# Patient Record
Sex: Female | Born: 1935
Health system: Southern US, Community
[De-identification: ages and names within clinical notes are randomized; demographics above are authoritative.]

## PROBLEM LIST (undated history)

## (undated) DIAGNOSIS — N8110 Cystocele, unspecified: Secondary | ICD-10-CM

## (undated) DIAGNOSIS — I10 Essential (primary) hypertension: Secondary | ICD-10-CM

## (undated) HISTORY — PX: ABDOMINAL HYSTERECTOMY: SHX81

## (undated) HISTORY — PX: PACEMAKER INSERTION: SHX728

---

## 1985-09-03 HISTORY — PX: BREAST BIOPSY: SHX20

## 1988-09-03 HISTORY — PX: BREAST SURGERY: SHX581

## 2003-09-04 HISTORY — PX: COLONOSCOPY: SHX174

## 2004-04-21 ENCOUNTER — Other Ambulatory Visit: Payer: Self-pay

## 2004-06-22 ENCOUNTER — Ambulatory Visit: Payer: Self-pay | Admitting: General Surgery

## 2004-09-29 ENCOUNTER — Ambulatory Visit: Payer: Self-pay | Admitting: Family Medicine

## 2004-11-07 ENCOUNTER — Ambulatory Visit: Payer: Self-pay | Admitting: Family Medicine

## 2007-08-04 ENCOUNTER — Ambulatory Visit: Payer: Self-pay | Admitting: Family Medicine

## 2008-09-20 ENCOUNTER — Ambulatory Visit: Payer: Self-pay | Admitting: Family Medicine

## 2009-11-04 ENCOUNTER — Ambulatory Visit: Payer: Self-pay | Admitting: Family Medicine

## 2010-12-20 ENCOUNTER — Ambulatory Visit: Payer: Self-pay | Admitting: Family Medicine

## 2010-12-21 ENCOUNTER — Ambulatory Visit: Payer: Self-pay | Admitting: Cardiology

## 2010-12-27 ENCOUNTER — Ambulatory Visit: Payer: Self-pay | Admitting: Cardiology

## 2012-02-20 ENCOUNTER — Ambulatory Visit: Payer: Self-pay | Admitting: Family Medicine

## 2012-08-12 ENCOUNTER — Ambulatory Visit: Payer: Self-pay | Admitting: Internal Medicine

## 2012-08-12 LAB — CBC WITH DIFFERENTIAL/PLATELET
Basophil #: 0.1 10*3/uL (ref 0.0–0.1)
Eosinophil #: 0.1 10*3/uL (ref 0.0–0.7)
HGB: 14.2 g/dL (ref 12.0–16.0)
Lymphocyte #: 0.7 10*3/uL — ABNORMAL LOW (ref 1.0–3.6)
MCV: 92 fL (ref 80–100)
Monocyte #: 0.3 x10 3/mm (ref 0.2–0.9)
Monocyte %: 6.5 %
Neutrophil #: 3.5 10*3/uL (ref 1.4–6.5)
Neutrophil %: 75.2 %
Platelet: 125 10*3/uL — ABNORMAL LOW (ref 150–440)
RBC: 4.63 10*6/uL (ref 3.80–5.20)
RDW: 13.5 % (ref 11.5–14.5)
WBC: 4.7 10*3/uL (ref 3.6–11.0)

## 2012-08-12 LAB — COMPREHENSIVE METABOLIC PANEL
Albumin: 4.2 g/dL (ref 3.4–5.0)
Alkaline Phosphatase: 100 U/L (ref 50–136)
BUN: 19 mg/dL — ABNORMAL HIGH (ref 7–18)
Bilirubin,Total: 0.8 mg/dL (ref 0.2–1.0)
Creatinine: 0.94 mg/dL (ref 0.60–1.30)
Glucose: 92 mg/dL (ref 65–99)
Osmolality: 283 (ref 275–301)
SGPT (ALT): 24 U/L (ref 12–78)
Total Protein: 7.6 g/dL (ref 6.4–8.2)

## 2013-07-22 ENCOUNTER — Ambulatory Visit: Payer: Self-pay | Admitting: Family Medicine

## 2014-01-27 DIAGNOSIS — I10 Essential (primary) hypertension: Secondary | ICD-10-CM | POA: Insufficient documentation

## 2014-01-27 DIAGNOSIS — Z45010 Encounter for checking and testing of cardiac pacemaker pulse generator [battery]: Secondary | ICD-10-CM | POA: Insufficient documentation

## 2014-08-05 ENCOUNTER — Ambulatory Visit: Payer: Self-pay | Admitting: Family Medicine

## 2014-09-28 ENCOUNTER — Ambulatory Visit: Payer: Self-pay | Admitting: Family Medicine

## 2015-04-09 ENCOUNTER — Ambulatory Visit
Admission: EM | Admit: 2015-04-09 | Discharge: 2015-04-09 | Disposition: A | Discharge status: Home / Self Care | Payer: Medicare Other | Attending: Internal Medicine | Admitting: Internal Medicine

## 2015-04-09 ENCOUNTER — Encounter: Payer: Self-pay | Admitting: Emergency Medicine

## 2015-04-09 DIAGNOSIS — T839XXA Unspecified complication of genitourinary prosthetic device, implant and graft, initial encounter: Secondary | ICD-10-CM

## 2015-04-09 DIAGNOSIS — N993 Prolapse of vaginal vault after hysterectomy: Secondary | ICD-10-CM | POA: Diagnosis not present

## 2015-04-09 HISTORY — DX: Cystocele, unspecified: N81.10

## 2015-04-09 HISTORY — DX: Essential (primary) hypertension: I10

## 2015-04-09 NOTE — ED Notes (Signed)
Patient presents here with the pessary blockage

## 2015-04-09 NOTE — ED Provider Notes (Signed)
CSN: [id number]     Arrival date & time 04/09/15  1134 History   First MD Initiated Contact with Patient 04/09/15 1410     Chief Complaint  Patient presents with  . Pessary Check   (Consider location/radiation/quality/duration/timing/severity/associated sxs/prior Treatment) HPI 81 yo F with her son - presents concerned that her pessary is "out of position and blocking my bowels".  Had two 6 pound babies NSD. Had a hysterectomy 40 years ago- uses vaginal estrogen cream-  has had bladder prolapse for many/undetermined-has worn multiple kinds of pessaries Currently using peassary, placed March 22, 2015 by Dr Johnnette Gourd difficulty. Patient  Reports this one has felt "odd" and recently she believes it shifted position making her anxious.  Presents requesting that it be removed. Voiding without difficulty, normal bowel movement yesterday. Past Medical History  Diagnosis Date  . Hypertension   . Bladder prolapse, female, acquired    Past Surgical History  Procedure Laterality Date  . Abdominal hysterectomy      40 years ago   History reviewed. No pertinent family history. History  Substance Use Topics  . Smoking status: Not on file  . Smokeless tobacco: Not on file  . Alcohol Use: Not on file   OB History    No data available     Review of Systems   Constitutional: No fever.  Eyes: No visual changes. ENT:No sore throat. Cardiovascular:Negative for chest pain/palpitations Respiratory: Negative for shortness of breath Gastrointestinal: No abdominal pain. No nausea,vomiting, diarrhea- no hard stools/constipation Genitourinary: Negative for dysuria. Normal urination. Musculoskeletal: Negative for back pain. FROM extremities without pain Skin: Negative for rash Neurological: Negative for headache, focal weakness or numbness  Allergies  Epinephrine and Sulfa antibiotics  Home Medications   Prior to Admission medications   Medication Sig Start Date End Date Taking?  Authorizing Provider  amLODipine (NORVASC) 5 MG tablet Take 5 mg by mouth daily.   Yes Historical Provider, MD  estradiol (ESTRACE) 0.1 MG/GM vaginal cream Place 1 Applicatorful vaginally at bedtime.   Yes Historical Provider, MD  hydrocortisone (ANUSOL-HC) 25 MG suppository Place 25 mg rectally 2 (two) times daily.   Yes Historical Provider, MD   BP 154/73 mmHg  Pulse 77  Temp(Src) 97 F (36.1 C) (Oral)  Resp 20  Ht 5\' 3"  (1.6 m)  Wt 173 lb (78.472 kg)  BMI 30.65 kg/m2  SpO2 100% Physical Exam    Constitutional -alert and oriented,well appearing and in no acute distress Head-atraumatic, normocephalic Eyes- conjugate gaze Nose- no congestion or rhinorrhea Mouth/throat- mucous membranes moist , Neck- supple  CV- regular rate, grossly normal heart sounds, pacer Resp-no distress, normal respiratory effort,clear to auscultation bilaterally GI- soft,non-tender,no distention GU- postmenopausal female,EGBUS wnl;  relaxed vaginal outlet, healthy appearing mucosa, redundant bladder and sidewalls; fingertip exam reveals Gelhorn in reasonable position.  Manipulated without difficulty- discussed with patient that no "blocked stool" could be palpated in the rectum. She and son both requested that it be removed nonetheless.  This was accomplished without any discomfort to the patient.On bimanual the bladder prolapse is easily reducible, vaginal cuff descends along with bladder prolapse. Adnexa negative and RV deferred.  Intact long neck Gelhorn bagged for patient to take home.  MSK- no tender, normal ROM, all extremities, ambulatory, self-care, uses 4 point cane Neuro- normal speech and language, no gross focal neurological deficit appreciated,  Skin-warm,dry ,intact; no rash noted Psych-mood and affect grossly normal; speech and behavior grossly normal  ED Course  Procedures (including critical care time)  Labs Review Labs Reviewed - No data to display  Imaging Review No results  found.   MDM   1. Pelvic relaxation due to vaginal vault prolapse, posthysterectomy   2. Problem with vaginal pessary, initial encounter    Diagnosis and treatment discussed. Carefully reviewed replacement of prolapse into the vagina should it protrude. Cautioned about urinary retention without the support of the pessary - pouching - Discussed modified knee -chest if needed to encourage reduction  Questions fielded, expectations and recommendations reviewed.  Patient and son expresse understanding and will call to coordinate care/follow up with Gyn on Monday morning. Advised we are available all weekend during clinic hours if assistance needed. RTC [facility] w exacerbation., concerns, fever, back pain or toher concerns.    Rae Halsted, PA-C 04/09/15 1651

## 2015-10-12 DIAGNOSIS — Z950 Presence of cardiac pacemaker: Secondary | ICD-10-CM | POA: Diagnosis not present

## 2015-10-12 DIAGNOSIS — I10 Essential (primary) hypertension: Secondary | ICD-10-CM | POA: Diagnosis not present

## 2015-10-12 DIAGNOSIS — Z45010 Encounter for checking and testing of cardiac pacemaker pulse generator [battery]: Secondary | ICD-10-CM | POA: Diagnosis not present

## 2015-11-11 ENCOUNTER — Ambulatory Visit (INDEPENDENT_AMBULATORY_CARE_PROVIDER_SITE_OTHER): Payer: Medicare Other | Admitting: Family Medicine

## 2015-11-11 ENCOUNTER — Encounter: Payer: Self-pay | Admitting: Family Medicine

## 2015-11-11 VITALS — BP 130/70 | HR 64 | Ht 63.0 in | Wt 173.0 lb

## 2015-11-11 DIAGNOSIS — B372 Candidiasis of skin and nail: Secondary | ICD-10-CM | POA: Diagnosis not present

## 2015-11-11 DIAGNOSIS — N814 Uterovaginal prolapse, unspecified: Secondary | ICD-10-CM | POA: Insufficient documentation

## 2015-11-11 DIAGNOSIS — IMO0002 Reserved for concepts with insufficient information to code with codable children: Secondary | ICD-10-CM | POA: Insufficient documentation

## 2015-11-11 MED ORDER — FLUCONAZOLE 150 MG PO TABS: 150.0000 mg | ORAL_TABLET | Freq: Once | ORAL | Status: DC

## 2015-11-11 MED ORDER — NYSTATIN 100000 UNIT/GM EX CREA: 1.0000 "application " | TOPICAL_CREAM | Freq: Two times a day (BID) | CUTANEOUS | Status: DC

## 2015-11-11 MED ORDER — CEPHALEXIN 500 MG PO CAPS: 500.0000 mg | ORAL_CAPSULE | Freq: Three times a day (TID) | ORAL | Status: DC

## 2015-11-11 NOTE — Progress Notes (Signed)
Name: Teresa DonningBetty Morris Krolikowski   MRN: [id number]    DOB: 1928-07-19   Date:11/11/2015       Progress Note  Subjective  Chief Complaint  Chief Complaint  Patient presents with  . Rash    under L) breast- itching and burns/ red x 3 days- tried otc Aloe and cream- not helping    Rash    No problem-specific assessment & plan notes found for this encounter.   Past Medical History  Diagnosis Date  . Hypertension   . Bladder prolapse, female, acquired     Past Surgical History  Procedure Laterality Date  . Abdominal hysterectomy      40 years ago    History reviewed. No pertinent family history.  Social History   Social History  . Marital Status: Married    Spouse Name: N/A  . Number of Children: N/A  . Years of Education: N/A   Occupational History  . Not on file.   Social History Main Topics  . Smoking status: Never Smoker   . Smokeless tobacco: Not on file  . Alcohol Use: No  . Drug Use: No  . Sexual Activity: No   Other Topics Concern  . Not on file   Social History Narrative    Allergies  Allergen Reactions  . Epinephrine   . Sulfa Antibiotics Rash     Review of Systems  Skin: Positive for rash.     Objective  Filed Vitals:   11/11/15 1011  BP: 130/70  Pulse: 64  Height: 5\' 3"  (1.6 m)  Weight: 173 lb (78.472 kg)    Physical Exam    Assessment & Plan  Problem List Items Addressed This Visit    None        Dr. Hayden Rasmusseneanna Juna Caban [facility]  11/11/2015

## 2015-11-11 NOTE — Progress Notes (Signed)
Name: Teresa Mays   MRN: [id number]    DOB: 1927/10/26   Date:11/11/2015       Progress Note  Subjective  Chief Complaint  Chief Complaint  Patient presents with  . Rash    under L) breast- itching and burns/ red x 3 days- tried otc Aloe and cream- not helping    Rash This is a new problem. The current episode started in the past 7 days. The problem has been gradually worsening since onset. The affected locations include the chest. The rash is characterized by redness and itchiness. She was exposed to nothing. Pertinent negatives include no anorexia, cough, diarrhea, fever, joint pain, rhinorrhea, shortness of breath or sore throat. Past treatments include moisturizer and cold compress. The treatment provided mild relief.    No problem-specific assessment & plan notes found for this encounter.   Past Medical History  Diagnosis Date  . Hypertension   . Bladder prolapse, female, acquired     Past Surgical History  Procedure Laterality Date  . Abdominal hysterectomy      40 years ago    History reviewed. No pertinent family history.  Social History   Social History  . Marital Status: Married    Spouse Name: N/A  . Number of Children: N/A  . Years of Education: N/A   Occupational History  . Not on file.   Social History Main Topics  . Smoking status: Never Smoker   . Smokeless tobacco: Not on file  . Alcohol Use: No  . Drug Use: No  . Sexual Activity: No   Other Topics Concern  . Not on file   Social History Narrative    Allergies  Allergen Reactions  . Epinephrine   . Sulfa Antibiotics Rash     Review of Systems  Constitutional: Negative for fever, chills, weight loss and malaise/fatigue.  HENT: Negative for ear discharge, ear pain, rhinorrhea and sore throat.   Eyes: Negative for blurred vision.  Respiratory: Negative for cough, sputum production, shortness of breath and wheezing.   Cardiovascular: Negative for chest pain, palpitations and  leg swelling.  Gastrointestinal: Negative for heartburn, nausea, abdominal pain, diarrhea, constipation, blood in stool, melena and anorexia.  Genitourinary: Negative for dysuria, urgency, frequency and hematuria.  Musculoskeletal: Negative for myalgias, back pain, joint pain and neck pain.  Skin: Positive for rash.  Neurological: Negative for dizziness, tingling, sensory change, focal weakness and headaches.  Endo/Heme/Allergies: Negative for environmental allergies and polydipsia. Does not bruise/bleed easily.  Psychiatric/Behavioral: Negative for depression and suicidal ideas. The patient is not nervous/anxious and does not have insomnia.      Objective  Filed Vitals:   11/11/15 1011  BP: 130/70  Pulse: 64  Height: 5\' 3"  (1.6 m)  Weight: 173 lb (78.472 kg)    Physical Exam  Constitutional: She is well-developed, well-nourished, and in no distress. No distress.  HENT:  Head: Normocephalic and atraumatic.  Right Ear: External ear normal.  Left Ear: External ear normal.  Nose: Nose normal.  Mouth/Throat: Oropharynx is clear and moist.  Eyes: Conjunctivae and EOM are normal. Pupils are equal, round, and reactive to light. Right eye exhibits no discharge. Left eye exhibits no discharge.  Neck: Normal range of motion. Neck supple. No JVD present. No thyromegaly present.  Cardiovascular: Normal rate, regular rhythm, normal heart sounds and intact distal pulses.  Exam reveals no gallop and no friction rub.   No murmur heard. Pulmonary/Chest: Effort normal and breath sounds normal.  Abdominal: Soft. Bowel  sounds are normal. She exhibits no mass. There is no tenderness. There is no guarding.  Musculoskeletal: Normal range of motion. She exhibits no edema.  Lymphadenopathy:    She has no cervical adenopathy.  Neurological: She is alert. She has normal reflexes.  Skin: Skin is warm and dry. She is not diaphoretic.  Psychiatric: Mood and affect normal.  Nursing note and vitals  reviewed.     Assessment & Plan  Problem List Items Addressed This Visit    None    Visit Diagnoses    Cutaneous candidiasis    -  Primary    Relevant Medications    fluconazole (DIFLUCAN) 150 MG tablet    cephALEXin (KEFLEX) 500 MG capsule    nystatin cream (MYCOSTATIN)         Dr. Hayden [facility]  11/11/2015

## 2015-11-17 ENCOUNTER — Other Ambulatory Visit: Payer: Self-pay

## 2015-11-23 ENCOUNTER — Other Ambulatory Visit: Payer: Self-pay

## 2015-11-23 MED ORDER — CLOTRIMAZOLE-BETAMETHASONE 1-0.05 % EX CREA: 1.0000 "application " | TOPICAL_CREAM | Freq: Two times a day (BID) | CUTANEOUS | Status: DC

## 2016-01-02 DIAGNOSIS — N3941 Urge incontinence: Secondary | ICD-10-CM | POA: Diagnosis not present

## 2016-01-02 DIAGNOSIS — Z4689 Encounter for fitting and adjustment of other specified devices: Secondary | ICD-10-CM | POA: Diagnosis not present

## 2016-01-16 DIAGNOSIS — N8110 Cystocele, unspecified: Secondary | ICD-10-CM | POA: Diagnosis not present

## 2016-03-13 DIAGNOSIS — I442 Atrioventricular block, complete: Secondary | ICD-10-CM | POA: Diagnosis not present

## 2016-04-03 DIAGNOSIS — N3281 Overactive bladder: Secondary | ICD-10-CM | POA: Diagnosis not present

## 2016-04-03 DIAGNOSIS — Z4689 Encounter for fitting and adjustment of other specified devices: Secondary | ICD-10-CM | POA: Diagnosis not present

## 2016-04-13 ENCOUNTER — Ambulatory Visit (INDEPENDENT_AMBULATORY_CARE_PROVIDER_SITE_OTHER): Payer: Medicare Other | Admitting: Family Medicine

## 2016-04-13 ENCOUNTER — Encounter: Payer: Self-pay | Admitting: Family Medicine

## 2016-04-13 VITALS — BP 120/62 | HR 70 | Ht 63.0 in | Wt 175.0 lb

## 2016-04-13 DIAGNOSIS — N644 Mastodynia: Secondary | ICD-10-CM

## 2016-04-13 DIAGNOSIS — N6452 Nipple discharge: Secondary | ICD-10-CM | POA: Diagnosis not present

## 2016-04-13 MED ORDER — AMOXICILLIN-POT CLAVULANATE 875-125 MG PO TABS: 1.0000 | ORAL_TABLET | Freq: Two times a day (BID) | ORAL | 0 refills | Status: DC

## 2016-04-13 NOTE — Progress Notes (Signed)
Name: Teresa DonningBetty Morris Mays   MRN: [id number]    DOB: 01-16-1928   Date:04/13/2016       Progress Note  Subjective  Chief Complaint  Chief Complaint  Patient presents with  . Breast Pain    tenderness in R) breast around 9:00. Has had a lumpectomy in the past on that breast    Right breast pain /onset 2 days ago/ sharp/ areolar/and nipple/ 9-10 o'clock area/ inferior discomfort periareolar 5 to 7 o'clock/ left breast  inverted/ with nipple discharge    No problem-specific Assessment & Plan notes found for this encounter.   Past Medical History:  Diagnosis Date  . Bladder prolapse, female, acquired   . Hypertension     Past Surgical History:  Procedure Laterality Date  . ABDOMINAL HYSTERECTOMY     40 years ago    History reviewed. No pertinent family history.  Social History   Social History  . Marital status: Married    Spouse name: N/A  . Number of children: N/A  . Years of education: N/A   Occupational History  . Not on file.   Social History Main Topics  . Smoking status: Never Smoker  . Smokeless tobacco: Not on file  . Alcohol use No  . Drug use: No  . Sexual activity: No   Other Topics Concern  . Not on file   Social History Narrative  . No narrative on file    Allergies  Allergen Reactions  . Epinephrine   . Sulfa Antibiotics Rash     Review of Systems  Constitutional: Negative for chills, fever, malaise/fatigue and weight loss.  HENT: Negative for ear discharge, ear pain and sore throat.   Eyes: Negative for blurred vision.  Respiratory: Negative for cough, sputum production, shortness of breath and wheezing.   Cardiovascular: Negative for chest pain, palpitations and leg swelling.  Gastrointestinal: Negative for abdominal pain, blood in stool, constipation, diarrhea, heartburn, melena and nausea.  Genitourinary: Negative for dysuria, frequency, hematuria and urgency.  Musculoskeletal: Negative for back pain, joint pain, myalgias and neck  pain.  Skin: Negative for rash.  Neurological: Negative for dizziness, tingling, sensory change, focal weakness and headaches.  Endo/Heme/Allergies: Negative for environmental allergies and polydipsia. Does not bruise/bleed easily.  Psychiatric/Behavioral: Negative for depression and suicidal ideas. The patient is not nervous/anxious and does not have insomnia.      Objective  Vitals:   04/13/16 1354  BP: 120/62  Pulse: 70  Weight: 175 lb (79.4 kg)  Height: 5\' 3"  (1.6 m)    Physical Exam  Constitutional: She is well-developed, well-nourished, and in no distress. No distress.  HENT:  Head: Normocephalic and atraumatic.  Right Ear: External ear normal.  Left Ear: External ear normal.  Nose: Nose normal.  Mouth/Throat: Oropharynx is clear and moist.  Eyes: Conjunctivae and EOM are normal. Pupils are equal, round, and reactive to light. Right eye exhibits no discharge. Left eye exhibits no discharge.  Neck: Normal range of motion. Neck supple. No JVD present. No thyromegaly present.  Cardiovascular: Normal rate, regular rhythm, normal heart sounds and intact distal pulses.  Exam reveals no gallop and no friction rub.   No murmur heard. Pulmonary/Chest: Effort normal and breath sounds normal. No respiratory distress. She has no wheezes. She has no rales. She exhibits no tenderness. Right breast exhibits tenderness. Right breast exhibits no inverted nipple, no mass, no nipple discharge and no skin change. Left breast exhibits inverted nipple, nipple discharge and skin change. Left breast exhibits  no mass and no tenderness. Breasts are asymmetrical.    Palpable breast tissue 10 o'clock/ tenderness  Abdominal: Soft. Bowel sounds are normal. She exhibits no mass. There is no tenderness. There is no guarding.  Musculoskeletal: Normal range of motion. She exhibits no edema.  Lymphadenopathy:    She has no cervical adenopathy.  Neurological: She is alert. She has normal reflexes.  Skin:  Skin is warm and dry. No rash noted. She is not diaphoretic. No erythema. No pallor.  Psychiatric: Mood and affect normal.  Nursing note and vitals reviewed.     Assessment & Plan  Problem List Items Addressed This Visit    None    Visit Diagnoses    Breast tenderness in female    -  Primary   Relevant Medications   amoxicillin-clavulanate (AUGMENTIN) 875-125 MG tablet   Other Relevant Orders   Ambulatory referral to General Surgery   Discharge from left nipple       ?ductal   Relevant Medications   amoxicillin-clavulanate (AUGMENTIN) 875-125 MG tablet   Other Relevant Orders   Ambulatory referral to General Surgery        Dr. Elizabeth Sauer [facility]  04/13/16

## 2016-04-18 ENCOUNTER — Encounter: Payer: Self-pay | Admitting: *Deleted

## 2016-04-25 ENCOUNTER — Encounter: Payer: Self-pay | Admitting: General Surgery

## 2016-04-25 ENCOUNTER — Ambulatory Visit (INDEPENDENT_AMBULATORY_CARE_PROVIDER_SITE_OTHER): Payer: Medicare Other | Admitting: General Surgery

## 2016-04-25 VITALS — BP 140/82 | HR 74 | Resp 14 | Ht 60.0 in | Wt 185.0 lb

## 2016-04-25 DIAGNOSIS — N644 Mastodynia: Secondary | ICD-10-CM | POA: Insufficient documentation

## 2016-04-25 NOTE — Progress Notes (Signed)
Patient ID: Teresa Mays, female   DOB: 08/28/1928, 82 y.o.   MRN: [id number]  Chief Complaint  Patient presents with  . Other    breast tenderness    HPI Teresa DonningBetty Morris Ovando is a 82 y.o. female here today for a evaluation of right breast tenderness. She states this has been going on for a week now. Patient states she did not feel any lumps.She was put on amoxicillin. Last mammogram was done 2015. Son present at visit.   HPI  Past Medical History:  Diagnosis Date  . Bladder prolapse, female, acquired   . Hypertension     Past Surgical History:  Procedure Laterality Date  . ABDOMINAL HYSTERECTOMY     40 years ago  . BREAST SURGERY  1990   Fibroadenom right   . COLONOSCOPY  2005  . PACEMAKER INSERTION  [id number]    History reviewed. No pertinent family history.  Social History Social History  Substance Use Topics  . Smoking status: Never Smoker  . Smokeless tobacco: Not on file  . Alcohol use No    Allergies  Allergen Reactions  . Epinephrine   . Sulfa Antibiotics Rash    Current Outpatient Prescriptions  Medication Sig Dispense Refill  . amoxicillin-clavulanate (AUGMENTIN) 875-125 MG tablet Take 1 tablet by mouth 2 (two) times daily. 20 tablet 0  . estradiol (ESTRACE) 0.1 MG/GM vaginal cream Place vaginally. Junious Silkindy Amundsen    . NORVASC 5 MG tablet 1 tablet daily. Dr Darrold JunkerParaschos     No current facility-administered medications for this visit.     Review of Systems Review of Systems  Constitutional: Negative.   Respiratory: Negative.   Cardiovascular: Negative.     Blood pressure 140/82, pulse 74, resp. rate 14, height 5' (1.524 m), weight 185 lb (83.9 kg).  Physical Exam Physical Exam  Constitutional: She is oriented to person, place, and time. She appears well-developed and well-nourished.  Eyes: Conjunctivae are normal. No scleral icterus.  Neck: Neck supple.  Cardiovascular: Normal rate, regular rhythm and normal heart sounds.    Pulmonary/Chest: Effort normal and breath sounds normal. Right breast exhibits no inverted nipple, no mass, no nipple discharge, no skin change and no tenderness. Left breast exhibits inverted nipple. Left breast exhibits no mass, no nipple discharge, no skin change and no tenderness.    Abdominal: Soft. Bowel sounds are normal. There is no tenderness.  Lymphadenopathy:    She has no cervical adenopathy.    She has no axillary adenopathy.  Neurological: She is alert and oriented to person, place, and time.  Skin: Skin is dry.    Data Reviewed 2015 mammograms reviewed and unremarkable.  Assessment    Benign breast exam, transient discomfort is not clearly related any palpable abnormality.    Plan    Had the patient not appreciate any focal tenderness I would not recommend screening mammograms, as she has we'll arrange for bilateral diagnostic mammograms.    Patient to have a  bilateral diagnostic mammogram. This information has been scribed by Ples SpecterJessica Qualls CMA.   Earline MayotteByrnett, Narelle Schoening W 04/25/2016, 8:22 PM

## 2016-04-25 NOTE — Patient Instructions (Signed)
  Patient to have a  bilateral diagnostic mammogram.

## 2016-05-04 ENCOUNTER — Encounter: Payer: Self-pay | Admitting: General Surgery

## 2016-05-10 DIAGNOSIS — Z950 Presence of cardiac pacemaker: Secondary | ICD-10-CM | POA: Diagnosis not present

## 2016-05-10 DIAGNOSIS — Z45010 Encounter for checking and testing of cardiac pacemaker pulse generator [battery]: Secondary | ICD-10-CM | POA: Diagnosis not present

## 2016-05-10 DIAGNOSIS — I10 Essential (primary) hypertension: Secondary | ICD-10-CM | POA: Diagnosis not present

## 2016-05-15 ENCOUNTER — Ambulatory Visit
Admission: RE | Admit: 2016-05-15 | Discharge: 2016-05-15 | Disposition: A | Discharge status: Home / Self Care | Payer: Medicare Other | Source: Ambulatory Visit | Attending: General Surgery | Admitting: General Surgery

## 2016-05-15 ENCOUNTER — Encounter: Payer: Self-pay | Admitting: General Surgery

## 2016-05-15 ENCOUNTER — Other Ambulatory Visit: Payer: Self-pay | Admitting: General Surgery

## 2016-05-15 DIAGNOSIS — N644 Mastodynia: Secondary | ICD-10-CM | POA: Diagnosis not present

## 2016-05-21 ENCOUNTER — Encounter: Payer: Self-pay | Admitting: General Surgery

## 2016-06-28 DIAGNOSIS — Z4689 Encounter for fitting and adjustment of other specified devices: Secondary | ICD-10-CM | POA: Diagnosis not present

## 2016-07-31 DIAGNOSIS — H25013 Cortical age-related cataract, bilateral: Secondary | ICD-10-CM | POA: Diagnosis not present

## 2016-08-16 ENCOUNTER — Ambulatory Visit (INDEPENDENT_AMBULATORY_CARE_PROVIDER_SITE_OTHER): Payer: Medicare Other | Admitting: Family Medicine

## 2016-08-16 ENCOUNTER — Encounter: Payer: Self-pay | Admitting: Family Medicine

## 2016-08-16 ENCOUNTER — Ambulatory Visit
Admission: RE | Admit: 2016-08-16 | Discharge: 2016-08-16 | Disposition: A | Discharge status: Home / Self Care | Payer: Medicare Other | Source: Ambulatory Visit | Attending: Family Medicine | Admitting: Family Medicine

## 2016-08-16 ENCOUNTER — Other Ambulatory Visit
Admission: RE | Admit: 2016-08-16 | Discharge: 2016-08-16 | Disposition: A | Discharge status: Home / Self Care | Payer: Medicare Other | Source: Ambulatory Visit | Attending: Family Medicine | Admitting: Family Medicine

## 2016-08-16 VITALS — BP 124/80 | HR 62 | Temp 98.3°F | Ht 60.0 in | Wt 183.0 lb

## 2016-08-16 DIAGNOSIS — J40 Bronchitis, not specified as acute or chronic: Secondary | ICD-10-CM

## 2016-08-16 DIAGNOSIS — R062 Wheezing: Secondary | ICD-10-CM | POA: Diagnosis not present

## 2016-08-16 DIAGNOSIS — R05 Cough: Secondary | ICD-10-CM | POA: Diagnosis not present

## 2016-08-16 LAB — CBC WITH DIFFERENTIAL/PLATELET
BASOS ABS: 0 10*3/uL (ref 0–0.1)
BASOS PCT: 1 %
Eosinophils Absolute: 0.2 10*3/uL (ref 0–0.7)
Eosinophils Relative: 3 %
HEMATOCRIT: 41.9 % (ref 35.0–47.0)
HEMOGLOBIN: 13.9 g/dL (ref 12.0–16.0)
Lymphocytes Relative: 28 %
Lymphs Abs: 1.4 10*3/uL (ref 1.0–3.6)
MCH: 30.3 pg (ref 26.0–34.0)
MCHC: 33.3 g/dL (ref 32.0–36.0)
MCV: 91.2 fL (ref 80.0–100.0)
Monocytes Absolute: 0.7 10*3/uL (ref 0.2–0.9)
Monocytes Relative: 13 %
NEUTROS ABS: 2.9 10*3/uL (ref 1.4–6.5)
NEUTROS PCT: 55 %
Platelets: 122 10*3/uL — ABNORMAL LOW (ref 150–440)
RBC: 4.6 MIL/uL (ref 3.80–5.20)
RDW: 14 % (ref 11.5–14.5)
WBC: 5.2 10*3/uL (ref 3.6–11.0)

## 2016-08-16 LAB — POCT INFLUENZA A/B
INFLUENZA A, POC: NEGATIVE
INFLUENZA B, POC: NEGATIVE

## 2016-08-16 MED ORDER — LEVOFLOXACIN 500 MG PO TABS: 500.0000 mg | ORAL_TABLET | Freq: Every day | ORAL | 0 refills | Status: DC

## 2016-08-16 MED ORDER — ALBUTEROL SULFATE (2.5 MG/3ML) 0.083% IN NEBU: 2.5000 mg | INHALATION_SOLUTION | Freq: Once | RESPIRATORY_TRACT | Status: DC

## 2016-08-16 MED ORDER — GUAIFENESIN-CODEINE 100-10 MG/5ML PO SYRP: 5.0000 mL | ORAL_SOLUTION | Freq: Three times a day (TID) | ORAL | 0 refills | Status: DC | PRN

## 2016-08-16 NOTE — Progress Notes (Signed)
Name: Teresa Mays   MRN: [id number]    DOB: 06-Jun-1928   Date:08/16/2016       Progress Note  Subjective  Chief Complaint  Chief Complaint  Patient presents with  . Bronchitis    cough and cong- "can't get anything up"-     Cough  This is a new problem. The current episode started in the past 7 days. The problem has been gradually worsening. The problem occurs every few minutes. The cough is productive of purulent sputum (yellow). Associated symptoms include wheezing. Pertinent negatives include no chest pain, chills, ear congestion, ear pain, fever, headaches, heartburn, hemoptysis, myalgias, nasal congestion, postnasal drip, rash, sore throat, shortness of breath or weight loss. She has tried OTC cough suppressant for the symptoms. The treatment provided moderate relief. There is no history of environmental allergies.    No problem-specific Assessment & Plan notes found for this encounter.   Past Medical History:  Diagnosis Date  . Bladder prolapse, female, acquired   . Hypertension     Past Surgical History:  Procedure Laterality Date  . ABDOMINAL HYSTERECTOMY     40 years ago  . BREAST SURGERY  1990   Fibroadenom right   . COLONOSCOPY  2005  . PACEMAKER INSERTION  2003,2012    Family History  Problem Relation Age of Onset  . Breast cancer Neg Hx     Social History   Social History  . Marital status: Widowed    Spouse name: N/A  . Number of children: N/A  . Years of education: N/A   Occupational History  . Not on file.   Social History Main Topics  . Smoking status: Never Smoker  . Smokeless tobacco: Not on file  . Alcohol use No  . Drug use: No  . Sexual activity: No   Other Topics Concern  . Not on file   Social History Narrative  . No narrative on file    Allergies  Allergen Reactions  . Epinephrine   . Sulfa Antibiotics Rash     Review of Systems  Constitutional: Negative for chills, fever, malaise/fatigue and weight loss.   HENT: Negative for ear discharge, ear pain, postnasal drip and sore throat.   Eyes: Negative for blurred vision.  Respiratory: Positive for cough and wheezing. Negative for hemoptysis, sputum production and shortness of breath.   Cardiovascular: Negative for chest pain, palpitations and leg swelling.  Gastrointestinal: Negative for abdominal pain, blood in stool, constipation, diarrhea, heartburn, melena and nausea.  Genitourinary: Negative for dysuria, frequency, hematuria and urgency.  Musculoskeletal: Negative for back pain, joint pain, myalgias and neck pain.  Skin: Negative for rash.  Neurological: Negative for dizziness, tingling, sensory change, focal weakness and headaches.  Endo/Heme/Allergies: Negative for environmental allergies and polydipsia. Does not bruise/bleed easily.  Psychiatric/Behavioral: Negative for depression and suicidal ideas. The patient is not nervous/anxious and does not have insomnia.      Objective  Vitals:   08/16/16 1130  BP: 124/80  Pulse: 62  Temp: 98.3 F (36.8 C)  TempSrc: Oral  SpO2: 95%  Weight: 183 lb (83 kg)  Height: 5' (1.524 m)    Physical Exam  Constitutional: She is well-developed, well-nourished, and in no distress. No distress.  HENT:  Head: Normocephalic and atraumatic.  Right Ear: External ear normal.  Left Ear: External ear normal.  Nose: Nose normal.  Mouth/Throat: Oropharynx is clear and moist.  Eyes: Conjunctivae and EOM are normal. Pupils are equal, round, and reactive to light. Right  eye exhibits no discharge. Left eye exhibits no discharge.  Neck: Normal range of motion. Neck supple. No JVD present. No thyromegaly present.  Cardiovascular: Normal rate, regular rhythm, normal heart sounds and intact distal pulses.  Exam reveals no gallop and no friction rub.   No murmur heard. Pulmonary/Chest: Effort normal. No respiratory distress. She has wheezes. She has no rales. She exhibits no tenderness.  Abdominal: Soft. Bowel  sounds are normal. She exhibits no mass. There is no tenderness. There is no guarding.  Musculoskeletal: Normal range of motion. She exhibits no edema.  Lymphadenopathy:    She has no cervical adenopathy.  Neurological: She is alert. She has normal reflexes.  Skin: Skin is warm and dry. She is not diaphoretic.  Psychiatric: Mood and affect normal.  Nursing note and vitals reviewed.     Assessment & Plan  Problem List Items Addressed This Visit    None    Visit Diagnoses    Bronchitis    -  Primary   Relevant Medications   albuterol (PROVENTIL) (2.5 MG/3ML) 0.083% nebulizer solution 2.5 mg   levofloxacin (LEVAQUIN) 500 MG tablet   guaiFENesin-codeine (ROBITUSSIN AC) 100-10 MG/5ML syrup   Other Relevant Orders   DG Chest 2 View   POCT Influenza A/B (Completed)        Dr. Hayden Rasmusseneanna Jones [facility]  08/16/16

## 2016-09-11 DIAGNOSIS — I442 Atrioventricular block, complete: Secondary | ICD-10-CM | POA: Diagnosis not present

## 2016-10-01 DIAGNOSIS — Z4689 Encounter for fitting and adjustment of other specified devices: Secondary | ICD-10-CM | POA: Diagnosis not present

## 2016-11-08 DIAGNOSIS — R600 Localized edema: Secondary | ICD-10-CM | POA: Diagnosis not present

## 2016-11-08 DIAGNOSIS — R42 Dizziness and giddiness: Secondary | ICD-10-CM | POA: Diagnosis not present

## 2016-11-08 DIAGNOSIS — Z950 Presence of cardiac pacemaker: Secondary | ICD-10-CM | POA: Diagnosis not present

## 2016-11-08 DIAGNOSIS — I10 Essential (primary) hypertension: Secondary | ICD-10-CM | POA: Diagnosis not present

## 2016-12-31 DIAGNOSIS — Z4689 Encounter for fitting and adjustment of other specified devices: Secondary | ICD-10-CM | POA: Diagnosis not present

## 2016-12-31 DIAGNOSIS — K640 First degree hemorrhoids: Secondary | ICD-10-CM | POA: Diagnosis not present

## 2017-03-12 DIAGNOSIS — I442 Atrioventricular block, complete: Secondary | ICD-10-CM | POA: Diagnosis not present

## 2017-04-01 DIAGNOSIS — N814 Uterovaginal prolapse, unspecified: Secondary | ICD-10-CM | POA: Diagnosis not present

## 2017-05-01 ENCOUNTER — Other Ambulatory Visit: Payer: Self-pay | Admitting: Family Medicine

## 2017-05-01 DIAGNOSIS — Z1231 Encounter for screening mammogram for malignant neoplasm of breast: Secondary | ICD-10-CM

## 2017-05-20 ENCOUNTER — Ambulatory Visit
Admission: RE | Admit: 2017-05-20 | Discharge: 2017-05-20 | Disposition: A | Discharge status: Home / Self Care | Payer: Medicare Other | Source: Ambulatory Visit | Attending: Family Medicine | Admitting: Family Medicine

## 2017-05-20 DIAGNOSIS — Z1231 Encounter for screening mammogram for malignant neoplasm of breast: Secondary | ICD-10-CM | POA: Diagnosis not present

## 2017-05-21 ENCOUNTER — Other Ambulatory Visit: Payer: Self-pay | Admitting: Family Medicine

## 2017-05-21 DIAGNOSIS — R928 Other abnormal and inconclusive findings on diagnostic imaging of breast: Secondary | ICD-10-CM

## 2017-05-21 DIAGNOSIS — N6310 Unspecified lump in the right breast, unspecified quadrant: Secondary | ICD-10-CM

## 2017-05-27 ENCOUNTER — Telehealth: Payer: Self-pay | Admitting: *Deleted

## 2017-05-27 NOTE — Telephone Encounter (Signed)
Patient called to state she had her bilateral screening mammogram done on 05/20/17-which came back as she needed added views. Patient wanted to see if you could review the images, she is concerned. She is scheduled on 06/10/17 for Right Diagnostic mammogram. Please advise.

## 2017-06-10 ENCOUNTER — Ambulatory Visit
Admission: RE | Admit: 2017-06-10 | Discharge: 2017-06-10 | Disposition: A | Discharge status: Home / Self Care | Payer: Medicare Other | Source: Ambulatory Visit | Attending: Family Medicine | Admitting: Family Medicine

## 2017-06-10 DIAGNOSIS — R928 Other abnormal and inconclusive findings on diagnostic imaging of breast: Secondary | ICD-10-CM | POA: Insufficient documentation

## 2017-06-10 DIAGNOSIS — N6310 Unspecified lump in the right breast, unspecified quadrant: Secondary | ICD-10-CM | POA: Diagnosis present

## 2017-06-10 DIAGNOSIS — N6489 Other specified disorders of breast: Secondary | ICD-10-CM | POA: Diagnosis not present

## 2017-06-10 DIAGNOSIS — N6001 Solitary cyst of right breast: Secondary | ICD-10-CM | POA: Diagnosis not present

## 2017-06-14 ENCOUNTER — Encounter: Payer: Self-pay | Admitting: Family Medicine

## 2017-06-14 ENCOUNTER — Ambulatory Visit (INDEPENDENT_AMBULATORY_CARE_PROVIDER_SITE_OTHER): Payer: Medicare Other | Admitting: Family Medicine

## 2017-06-14 VITALS — BP 120/70 | HR 80 | Ht 63.0 in | Wt 170.0 lb

## 2017-06-14 DIAGNOSIS — B029 Zoster without complications: Secondary | ICD-10-CM | POA: Diagnosis not present

## 2017-06-14 MED ORDER — VALACYCLOVIR HCL 1 G PO TABS: 1000.0000 mg | ORAL_TABLET | Freq: Two times a day (BID) | ORAL | 0 refills | Status: DC

## 2017-06-14 NOTE — Progress Notes (Signed)
Name: Teresa Mays   MRN: [id number]    DOB: November 16, 1927   Date:06/14/2017       Progress Note  Subjective  Chief Complaint  Chief Complaint  Patient presents with  . Rash    hurting under L) breast- has 2 splotches of red areas that itch and are sore/ one on L) side and one higher up on back    Rash  This is a new problem. The current episode started in the past 7 days. The problem has been gradually worsening since onset. The affected locations include the torso. The rash is characterized by blistering, redness and pain. Pertinent negatives include no cough, diarrhea, eye pain, fever, joint pain, shortness of breath or sore throat. The treatment provided mild relief. Her past medical history is significant for varicella.    No problem-specific Assessment & Plan notes found for this encounter.   Past Medical History:  Diagnosis Date  . Bladder prolapse, female, acquired   . Hypertension     Past Surgical History:  Procedure Laterality Date  . ABDOMINAL HYSTERECTOMY     40 years ago  . BREAST SURGERY  1990   Fibroadenom right   . COLONOSCOPY  2005  . PACEMAKER INSERTION  2003,2012    Family History  Problem Relation Age of Onset  . Breast cancer Neg Hx     Social History   Social History  . Marital status: Widowed    Spouse name: N/A  . Number of children: N/A  . Years of education: N/A   Occupational History  . Not on file.   Social History Main Topics  . Smoking status: Never Smoker  . Smokeless tobacco: Not on file  . Alcohol use No  . Drug use: No  . Sexual activity: No   Other Topics Concern  . Not on file   Social History Narrative  . No narrative on file    Allergies  Allergen Reactions  . Epinephrine   . Sulfa Antibiotics Rash    Outpatient Medications Prior to Visit  Medication Sig Dispense Refill  . estradiol (ESTRACE) 0.1 MG/GM vaginal cream Place vaginally. Junious Silk    . guaiFENesin-codeine (ROBITUSSIN AC) 100-10  MG/5ML syrup Take 5 mLs by mouth 3 (three) times daily as needed for cough. 150 mL 0  . levofloxacin (LEVAQUIN) 500 MG tablet Take 1 tablet (500 mg total) by mouth daily. 7 tablet 0  . loratadine (CLARITIN) 10 MG tablet Take 10 mg by mouth daily. otc    . NORVASC 5 MG tablet 1 tablet daily. Dr Darrold Junker     Facility-Administered Medications Prior to Visit  Medication Dose Route Frequency Provider Last Rate Last Dose  . albuterol (PROVENTIL) (2.5 MG/3ML) 0.083% nebulizer solution 2.5 mg  2.5 mg Nebulization Once Duanne Limerick, MD        Review of Systems  Constitutional: Negative for chills, fever, malaise/fatigue and weight loss.  HENT: Negative for ear discharge, ear pain and sore throat.   Eyes: Negative for blurred vision and pain.  Respiratory: Negative for cough, sputum production, shortness of breath and wheezing.   Cardiovascular: Negative for chest pain, palpitations and leg swelling.  Gastrointestinal: Negative for abdominal pain, blood in stool, constipation, diarrhea, heartburn, melena and nausea.  Genitourinary: Negative for dysuria, frequency, hematuria and urgency.  Musculoskeletal: Negative for back pain, joint pain, myalgias and neck pain.  Skin: Positive for rash.  Neurological: Negative for dizziness, tingling, sensory change, focal weakness and headaches.  Endo/Heme/Allergies: Negative  for environmental allergies and polydipsia. Does not bruise/bleed easily.  Psychiatric/Behavioral: Negative for depression and suicidal ideas. The patient is not nervous/anxious and does not have insomnia.      Objective  Vitals:   06/14/17 1518  BP: 120/70  Pulse: 80  Weight: 170 lb (77.1 kg)  Height:  (1.6 m)    Physical Exam  Constitutional: She is well-developed, well-nourished, and in no distress. No distress.  HENT:  Head: Normocephalic and atraumatic.  Right Ear: External ear normal.  Left Ear: External ear normal.  Nose: Nose normal.  Mouth/Throat: Oropharynx  is clear and moist.  Eyes: Pupils are equal, round, and reactive to light. Conjunctivae and EOM are normal. Right eye exhibits no discharge. Left eye exhibits no discharge.  Neck: Normal range of motion. Neck supple. No JVD present. No thyromegaly present.  Cardiovascular: Normal rate, regular rhythm, normal heart sounds and intact distal pulses.  Exam reveals no gallop and no friction rub.   No murmur heard. Pulmonary/Chest: Effort normal and breath sounds normal. She has no wheezes. She has no rales.  Abdominal: Soft. Bowel sounds are normal. She exhibits no mass. There is no tenderness. There is no guarding.  Musculoskeletal: Normal range of motion. She exhibits no edema.  Lymphadenopathy:    She has no cervical adenopathy.  Neurological: She is alert. She has normal reflexes.  Skin: Skin is warm and dry. She is not diaphoretic.  Psychiatric: Mood and affect normal.  Nursing note and vitals reviewed.     Assessment & Plan  Problem List Items Addressed This Visit    None    Visit Diagnoses    Herpes zoster without complication    -  Primary   Relevant Medications   valACYclovir (VALTREX) 1000 MG tablet      Meds ordered this encounter  Medications  . valACYclovir (VALTREX) 1000 MG tablet    Sig: Take 1 tablet (1,000 mg total) by mouth 2 (two) times daily.    Dispense:  20 tablet    Refill:  0      Dr. [facility]  06/14/17

## 2017-06-14 NOTE — Patient Instructions (Signed)
Shingles Shingles, which is also known as herpes zoster, is an infection that causes a painful skin rash and fluid-filled blisters. Shingles is not related to genital herpes, which is a sexually transmitted infection. Shingles only develops in people who:  Have had chickenpox.  Have received the chickenpox vaccine. (This is rare.)  What are the causes? Shingles is caused by varicella-zoster virus (VZV). This is the same virus that causes chickenpox. After exposure to VZV, the virus stays in the body in an inactive (dormant) state. Shingles develops if the virus reactivates. This can happen many years after the initial exposure to VZV. It is not known what causes this virus to reactivate. What increases the risk? People who have had chickenpox or received the chickenpox vaccine are at risk for shingles. Infection is more common in people who:  Are older than age 50.  Have a weakened defense (immune) system, such as those with HIV, AIDS, or cancer.  Are taking medicines that weaken the immune system, such as transplant medicines.  Are under great stress.  What are the signs or symptoms? Early symptoms of this condition include itching, tingling, and pain in an area on your skin. Pain may be described as burning, stabbing, or throbbing. A few days or weeks after symptoms start, a painful red rash appears, usually on one side of the body in a bandlike or beltlike pattern. The rash eventually turns into fluid-filled blisters that break open, scab over, and dry up in about 2-3 weeks. At any time during the infection, you may also develop:  A fever.  Chills.  A headache.  An upset stomach.  How is this diagnosed? This condition is diagnosed with a skin exam. Sometimes, skin or fluid samples are taken from the blisters before a diagnosis is made. These samples are examined under a microscope or sent to a lab for testing. How is this treated? There is no specific cure for this condition.  Your health care provider will probably prescribe medicines to help you manage pain, recover more quickly, and avoid long-term problems. Medicines may include:  Antiviral drugs.  Anti-inflammatory drugs.  Pain medicines.  If the area involved is on your face, you may be referred to a specialist, such as an eye doctor (ophthalmologist) or an ear, nose, and throat (ENT) doctor to help you avoid eye problems, chronic pain, or disability. Follow these instructions at home: Medicines  Take medicines only as directed by your health care provider.  Apply an anti-itch or numbing cream to the affected area as directed by your health care provider. Blister and Rash Care  Take a cool bath or apply cool compresses to the area of the rash or blisters as directed by your health care provider. This may help with pain and itching.  Keep your rash covered with a loose bandage (dressing). Wear loose-fitting clothing to help ease the pain of material rubbing against the rash.  Keep your rash and blisters clean with mild soap and cool water or as directed by your health care provider.  Check your rash every day for signs of infection. These include redness, swelling, and pain that lasts or increases.  Do not pick your blisters.  Do not scratch your rash. General instructions  Rest as directed by your health care provider.  Keep all follow-up visits as directed by your health care provider. This is important.  Until your blisters scab over, your infection can cause chickenpox in people who have never had it or been vaccinated   against it. To prevent this from happening, avoid contact with other people, especially: ? Babies. ? Pregnant women. ? Children who have eczema. ? Elderly people who have transplants. ? People who have chronic illnesses, such as leukemia or AIDS. Contact a health care provider if:  Your pain is not relieved with prescribed medicines.  Your pain does not get better after  the rash heals.  Your rash looks infected. Signs of infection include redness, swelling, and pain that lasts or increases. Get help right away if:  The rash is on your face or nose.  You have facial pain, pain around your eye area, or loss of feeling on one side of your face.  You have ear pain or you have ringing in your ear.  You have loss of taste.  Your condition gets worse. This information is not intended to replace advice given to you by your health care provider. Make sure you discuss any questions you have with your health care provider. Document Released: 08/20/2005 Document Revised: 04/15/2016 Document Reviewed: 07/01/2014 Elsevier Interactive Patient Education  2017 [employer]  

## 2017-07-11 ENCOUNTER — Other Ambulatory Visit: Payer: Self-pay

## 2017-07-11 DIAGNOSIS — B0229 Other postherpetic nervous system involvement: Secondary | ICD-10-CM

## 2017-07-11 MED ORDER — PREGABALIN 50 MG PO CAPS: 50.0000 mg | ORAL_CAPSULE | Freq: Three times a day (TID) | ORAL | 0 refills | Status: DC

## 2017-07-12 MED ORDER — AZITHROMYCIN 250 MG PO TABS: ORAL_TABLET | ORAL | 0 refills | Status: DC

## 2017-07-22 ENCOUNTER — Other Ambulatory Visit: Payer: Self-pay

## 2017-07-31 DIAGNOSIS — N952 Postmenopausal atrophic vaginitis: Secondary | ICD-10-CM | POA: Diagnosis not present

## 2017-07-31 DIAGNOSIS — Z4689 Encounter for fitting and adjustment of other specified devices: Secondary | ICD-10-CM | POA: Diagnosis not present

## 2017-07-31 DIAGNOSIS — N814 Uterovaginal prolapse, unspecified: Secondary | ICD-10-CM | POA: Diagnosis not present

## 2017-09-07 ENCOUNTER — Encounter: Payer: Self-pay | Admitting: Emergency Medicine

## 2017-09-07 DIAGNOSIS — Y9389 Activity, other specified: Secondary | ICD-10-CM | POA: Insufficient documentation

## 2017-09-07 DIAGNOSIS — T192XXA Foreign body in vulva and vagina, initial encounter: Secondary | ICD-10-CM | POA: Insufficient documentation

## 2017-09-07 DIAGNOSIS — Z950 Presence of cardiac pacemaker: Secondary | ICD-10-CM | POA: Diagnosis not present

## 2017-09-07 DIAGNOSIS — X58XXXA Exposure to other specified factors, initial encounter: Secondary | ICD-10-CM | POA: Insufficient documentation

## 2017-09-07 DIAGNOSIS — Z9889 Other specified postprocedural states: Secondary | ICD-10-CM | POA: Insufficient documentation

## 2017-09-07 DIAGNOSIS — I10 Essential (primary) hypertension: Secondary | ICD-10-CM | POA: Diagnosis not present

## 2017-09-07 DIAGNOSIS — Y929 Unspecified place or not applicable: Secondary | ICD-10-CM | POA: Insufficient documentation

## 2017-09-07 DIAGNOSIS — Z79899 Other long term (current) drug therapy: Secondary | ICD-10-CM | POA: Diagnosis not present

## 2017-09-07 DIAGNOSIS — N8189 Other female genital prolapse: Secondary | ICD-10-CM | POA: Diagnosis not present

## 2017-09-07 DIAGNOSIS — Y998 Other external cause status: Secondary | ICD-10-CM | POA: Insufficient documentation

## 2017-09-07 NOTE — ED Triage Notes (Signed)
Patient states that her pessary has been twisted times 10 days.

## 2017-09-08 ENCOUNTER — Emergency Department
Admission: EM | Admit: 2017-09-08 | Discharge: 2017-09-08 | Disposition: A | Discharge status: Home / Self Care | Payer: Medicare Other | Attending: Emergency Medicine | Admitting: Emergency Medicine

## 2017-09-08 DIAGNOSIS — N8110 Cystocele, unspecified: Secondary | ICD-10-CM

## 2017-09-08 DIAGNOSIS — Z4689 Encounter for fitting and adjustment of other specified devices: Secondary | ICD-10-CM

## 2017-09-08 NOTE — ED Notes (Signed)
Pt ambulatory with cane to wheel chair upon discharge. Verbalized understanding of discharge instructions and follow-up care. A&O x4. Skin warm and dry. Pt denies any pain.

## 2017-09-08 NOTE — ED Provider Notes (Signed)
[facility] Emergency Department Provider Note   ____________________________________________   First MD Initiated Contact with Patient 09/08/17 0119     (approximate)  I have reviewed the triage vital signs and the nursing notes.   HISTORY  Chief Complaint Pessary Check    HPI Teresa Mays is a 84 y.o. female who presents to the ED from home requesting that her pessary be removed.  Patient last had her pessary inserted by her GYN on November 28.  According to the clinic note, she was originally wearing a 3 1/4 longstem Gellhorn pessary.  At that visit, patient was changed to a size 3.  States for the past 10 days she feels like the handle of the pessary has rotated and causing her some constipation issues, although she was able to have a bowel movement last evening.  Due to the smaller size, she is unable to reach the handle to readjust her pessary.  Denies associated fever, chills, chest pain, shortness of breath, abdominal pain, nausea, vomiting, dysuria.  States she is easily able to urinate.  Denies recent travel or trauma.   Past Medical History:  Diagnosis Date  . Bladder prolapse, female, acquired   . Hypertension     Patient Active Problem List   Diagnosis Date Noted  . Mastalgia 04/25/2016  . Bladder cystocele 11/11/2015  . BP (high blood pressure) 01/27/2014  . Encounter for checking and testing of cardiac pacemaker pulse generator (battery) 01/27/2014    Past Surgical History:  Procedure Laterality Date  . ABDOMINAL HYSTERECTOMY     40 years ago  . BREAST SURGERY  1990   Fibroadenom right   . COLONOSCOPY  2005  . PACEMAKER INSERTION  [id number]    Prior to Admission medications   Medication Sig Start Date End Date Taking? Authorizing Provider  azithromycin (ZITHROMAX) 250 MG tablet ZPack-Use as directed 07/12/17   Duanne LimerickJones, Deanna C, MD  estradiol (ESTRACE) 0.1 MG/GM vaginal cream Place vaginally. [facility]    [provider]  guaiFENesin-codeine [facility](ROBITUSSIN AC) 100-10 MG/5ML syrup Take 5 mLs by mouth 3 (three) times daily as needed for cough. 08/16/16   Duanne LimerickJones, Deanna C, MD  loratadine (CLARITIN) 10 MG tablet Take 10 mg by mouth daily. otc    [provider]  NORVASC 5 MG tablet 1 tablet daily. Dr Darrold JunkerParaschos 10/19/15   [provider]  pregabalin (LYRICA) 50 MG capsule Take 1 capsule (50 mg total) 3 (three) times daily by mouth. 07/11/17   Duanne LimerickJones, Deanna C, MD  valACYclovir (VALTREX) 1000 MG tablet Take 1 tablet (1,000 mg total) by mouth 2 (two) times daily. 06/14/17   Duanne LimerickJones, Deanna C, MD    Allergies Epinephrine and Sulfa antibiotics  Family History  Problem Relation Age of Onset  . Breast cancer Neg Hx     Social History Social History   Tobacco Use  . Smoking status: Never Smoker  . Smokeless tobacco: Never Used  Substance Use Topics  . Alcohol use: No    Alcohol/week: 0.0 oz  . Drug use: No    Review of Systems  Constitutional: No fever/chills. Eyes: No visual changes. ENT: No sore throat. Cardiovascular: Denies chest pain. Respiratory: Denies shortness of breath. Gastrointestinal: No abdominal pain.  No nausea, no vomiting.  No diarrhea.  No constipation. Genitourinary: Positive for pessary issue.  Negative for dysuria. Musculoskeletal: Negative for back pain. Skin: Negative for rash. Neurological: Negative for headaches, focal weakness or numbness.   ____________________________________________  PHYSICAL EXAM:  VITAL SIGNS: ED Triage Vitals  Enc Vitals Group     BP 09/07/17 2129 (!) 174/96     Pulse Rate 09/07/17 2129 99     Resp 09/07/17 2129 18     Temp 09/07/17 2129 97.6 F (36.4 C)     Temp Source 09/07/17 2129 Oral     SpO2 09/07/17 2129 98 %     Weight 09/07/17 2130 175 lb (79.4 kg)     Height 09/07/17 2130 5\' 3"  (1.6 m)     Head Circumference --      Peak Flow --      Pain Score 09/07/17 2129 5     Pain Loc --      Pain Edu? --       Excl. in GC? --     Constitutional: Alert and oriented. Well appearing and in no acute distress. Eyes: Conjunctivae are normal. PERRL. EOMI. Head: Atraumatic. Nose: No congestion/rhinnorhea. Mouth/Throat: Mucous membranes are moist.  Oropharynx non-erythematous. Neck: No stridor.   Cardiovascular: Normal rate, regular rhythm. Grossly normal heart sounds.  Good peripheral circulation. Respiratory: Normal respiratory effort.  No retractions. Lungs CTAB. Gastrointestinal: Soft and nontender to light or deep palpation. No distention. No abdominal bruits. No CVA tenderness. Genitourinary: External exam within normal limits.  Digital exam reveals pessary handle has rotated 90 degrees and is facing the left vaginal wall. Musculoskeletal: No lower extremity tenderness nor edema.  No joint effusions. Neurologic:  Normal speech and language. No gross focal neurologic deficits are appreciated. No gait instability. Skin:  Skin is warm, dry and intact. No rash noted. Psychiatric: Mood and affect are normal. Speech and behavior are normal.  ____________________________________________   LABS (all labs ordered are listed, but only abnormal results are displayed)  Labs Reviewed - No data to display ____________________________________________  EKG  None ____________________________________________  RADIOLOGY  No results found.  ____________________________________________   PROCEDURES  Procedure(s) performed:   3 long stem Gellhorn pessary removed without difficulty  Procedures  Critical Care performed: No  ____________________________________________   INITIAL IMPRESSION / ASSESSMENT AND PLAN / ED COURSE  As part of my medical decision making, I reviewed the following data within the electronic MEDICAL RECORD NUMBER History obtained from family, Nursing notes reviewed and incorporated and Notes from prior ED visits.   84 year old female who presents for pessary removal.  Pessary  was successfully removed and given back to the patient per her request.  She will follow-up with her GYN this coming week for another pessary insertion.  Strict return precautions given.  Patient and son verbalize understanding and agree with plan of care.      ____________________________________________   FINAL CLINICAL IMPRESSION(S) / ED DIAGNOSES  Final diagnoses:  History of removal of pessary  Pelvic relaxation due to vaginal prolapse     ED Discharge Orders    None       Note:  This document was prepared using Dragon voice recognition software and may include unintentional dictation errors.    Irean Hong, MD 09/08/17 [phone]

## 2017-09-08 NOTE — ED Notes (Signed)
Pt reports pessary "turned" 12/24 but was able to turn it back. Pt reports having difficulty urinating 11/28; pessary removed (3 1/4") and replaced with a smaller, floater 3" one. Pt reports that today and yesterday, the handle has completely turned and when she "puts a glove on and feels inside her rectum, [she] can feel it squishing her intestines." Pt wanting to get pessary removed today.

## 2017-09-08 NOTE — Discharge Instructions (Signed)
Your pessary was removed tonight secondary to discomfort.  Please see your GYN doctor next week to be fitted for a new pessary.  Return to the ER for worsening symptoms, or other concerns.

## 2017-09-09 DIAGNOSIS — Z4689 Encounter for fitting and adjustment of other specified devices: Secondary | ICD-10-CM | POA: Diagnosis not present

## 2017-09-09 DIAGNOSIS — N952 Postmenopausal atrophic vaginitis: Secondary | ICD-10-CM | POA: Diagnosis not present

## 2017-09-09 DIAGNOSIS — N814 Uterovaginal prolapse, unspecified: Secondary | ICD-10-CM | POA: Diagnosis not present

## 2017-09-10 DIAGNOSIS — I442 Atrioventricular block, complete: Secondary | ICD-10-CM | POA: Diagnosis not present

## 2017-09-12 DIAGNOSIS — R011 Cardiac murmur, unspecified: Secondary | ICD-10-CM | POA: Diagnosis not present

## 2017-09-12 DIAGNOSIS — Z950 Presence of cardiac pacemaker: Secondary | ICD-10-CM | POA: Diagnosis not present

## 2017-09-12 DIAGNOSIS — I10 Essential (primary) hypertension: Secondary | ICD-10-CM | POA: Diagnosis not present

## 2017-09-13 DIAGNOSIS — Z4689 Encounter for fitting and adjustment of other specified devices: Secondary | ICD-10-CM | POA: Diagnosis not present

## 2017-09-13 DIAGNOSIS — Z9289 Personal history of other medical treatment: Secondary | ICD-10-CM | POA: Diagnosis not present

## 2017-12-18 DIAGNOSIS — N814 Uterovaginal prolapse, unspecified: Secondary | ICD-10-CM | POA: Diagnosis not present

## 2017-12-18 DIAGNOSIS — Z4689 Encounter for fitting and adjustment of other specified devices: Secondary | ICD-10-CM | POA: Diagnosis not present

## 2018-03-11 DIAGNOSIS — I442 Atrioventricular block, complete: Secondary | ICD-10-CM | POA: Diagnosis not present

## 2018-03-11 DIAGNOSIS — Z4689 Encounter for fitting and adjustment of other specified devices: Secondary | ICD-10-CM | POA: Diagnosis not present

## 2018-03-11 DIAGNOSIS — N952 Postmenopausal atrophic vaginitis: Secondary | ICD-10-CM | POA: Diagnosis not present

## 2018-03-11 DIAGNOSIS — N814 Uterovaginal prolapse, unspecified: Secondary | ICD-10-CM | POA: Diagnosis not present

## 2018-03-18 DIAGNOSIS — Z950 Presence of cardiac pacemaker: Secondary | ICD-10-CM | POA: Diagnosis not present

## 2018-03-18 DIAGNOSIS — Z45010 Encounter for checking and testing of cardiac pacemaker pulse generator [battery]: Secondary | ICD-10-CM | POA: Diagnosis not present

## 2018-03-18 DIAGNOSIS — I10 Essential (primary) hypertension: Secondary | ICD-10-CM | POA: Diagnosis not present

## 2018-06-19 DIAGNOSIS — N952 Postmenopausal atrophic vaginitis: Secondary | ICD-10-CM | POA: Diagnosis not present

## 2018-06-19 DIAGNOSIS — Z4689 Encounter for fitting and adjustment of other specified devices: Secondary | ICD-10-CM | POA: Diagnosis not present

## 2018-06-19 DIAGNOSIS — N814 Uterovaginal prolapse, unspecified: Secondary | ICD-10-CM | POA: Diagnosis not present

## 2018-09-09 DIAGNOSIS — I442 Atrioventricular block, complete: Secondary | ICD-10-CM | POA: Diagnosis not present

## 2018-09-16 ENCOUNTER — Ambulatory Visit (INDEPENDENT_AMBULATORY_CARE_PROVIDER_SITE_OTHER): Payer: Medicare Other | Admitting: Family Medicine

## 2018-09-16 ENCOUNTER — Encounter: Payer: Self-pay | Admitting: Family Medicine

## 2018-09-16 VITALS — BP 122/62 | HR 80 | Temp 98.8°F | Ht 63.0 in | Wt 164.0 lb

## 2018-09-16 DIAGNOSIS — Z1239 Encounter for other screening for malignant neoplasm of breast: Secondary | ICD-10-CM

## 2018-09-16 MED ORDER — GUAIFENESIN-CODEINE 100-10 MG/5ML PO SYRP: 5.0000 mL | ORAL_SOLUTION | Freq: Three times a day (TID) | ORAL | 0 refills | Status: DC | PRN

## 2018-09-16 MED ORDER — AZITHROMYCIN 250 MG PO TABS: ORAL_TABLET | ORAL | 0 refills | Status: DC

## 2018-09-16 NOTE — Progress Notes (Signed)
Date:  09/16/2018   Name:  Teresa Teresa Mays   DOB:  May 06, 1928   MRN:  [id number]   Chief Complaint: Sinusitis (cough- no production- taking sudafed daily)  Sinusitis  This is a new problem. The current episode started in the past 7 days. The problem has been gradually worsening since onset. There has been no fever. Associated symptoms include congestion, coughing, ear pain, a hoarse voice and a sore throat. Pertinent negatives include no chills, diaphoresis, headaches, neck pain, shortness of breath, sinus pressure, sneezing or swollen glands. Past treatments include acetaminophen and oral decongestants. The treatment provided mild relief.  Cough  This is a new problem. The current episode started in the past 7 days. The problem has been waxing and waning. The cough is non-productive. Associated symptoms include ear pain and a sore throat. Pertinent negatives include no chills, eye redness, fever, headaches, myalgias, rash, rhinorrhea, shortness of breath or wheezing. There is no history of environmental allergies.    Review of Systems  Constitutional: Negative.  Negative for chills, diaphoresis, fatigue, fever and unexpected weight change.  HENT: Positive for congestion, ear pain, hoarse voice and sore throat. Negative for ear discharge, rhinorrhea, sinus pressure and sneezing.   Eyes: Negative for photophobia, pain, discharge, redness and itching.  Respiratory: Positive for cough. Negative for shortness of breath, wheezing and stridor.   Gastrointestinal: Negative for abdominal pain, blood in stool, constipation, diarrhea, nausea and vomiting.  Endocrine: Negative for cold intolerance, heat intolerance, polydipsia, polyphagia and polyuria.  Genitourinary: Negative for dysuria, flank pain, frequency, hematuria, menstrual problem, pelvic pain, urgency, vaginal bleeding and vaginal discharge.  Musculoskeletal: Negative for arthralgias, back pain, myalgias and neck pain.  Skin: Negative  for rash.  Allergic/Immunologic: Negative for environmental allergies and food allergies.  Neurological: Negative for dizziness, weakness, light-headedness, numbness and headaches.  Hematological: Negative for adenopathy. Does not bruise/bleed easily.  Psychiatric/Behavioral: Negative for dysphoric mood. The patient is not nervous/anxious.     Patient Active Problem List   Diagnosis Date Noted  . Mastalgia 04/25/2016  . Bladder cystocele 11/11/2015  . BP (high blood pressure) 01/27/2014  . Encounter for checking and testing of cardiac pacemaker pulse generator (battery) 01/27/2014    Allergies  Allergen Reactions  . Epinephrine   . Sulfa Antibiotics Rash    Past Surgical History:  Procedure Laterality Date  . ABDOMINAL HYSTERECTOMY     40 years ago  . BREAST SURGERY  1990   Fibroadenom right   . COLONOSCOPY  2005  . PACEMAKER INSERTION  [id number]    Social History   Tobacco Use  . Smoking status: Never Smoker  . Smokeless tobacco: Never Used  Substance Use Topics  . Alcohol use: No    Alcohol/week: 0.0 standard drinks  . Drug use: No     Medication list has been reviewed and updated.  Current Meds  Medication Sig  . estradiol (ESTRACE) 0.1 MG/GM vaginal cream Teresa Mays vaginally. Junious Silk  . NORVASC 5 MG tablet 1 tablet daily. Dr Darrold Junker   Current Facility-Administered Medications for the 09/16/18 encounter (Office Visit) with Duanne Limerick, MD  Medication  . albuterol (PROVENTIL) (2.5 MG/3ML) 0.083% nebulizer solution 2.5 mg    PHQ 2/9 Scores 06/14/2017 11/11/2015  PHQ - 2 Score 0 0  PHQ- 9 Score 0 -    Physical Exam Vitals signs and nursing note reviewed.  Constitutional:      General: She is not in acute distress.    Appearance: She is  not diaphoretic.  HENT:     Head: Normocephalic and atraumatic.     Right Ear: External ear normal.     Left Ear: External ear normal.     Nose: Nose normal.  Eyes:     General:        Right eye: No  discharge.        Left eye: No discharge.     Conjunctiva/sclera: Conjunctivae normal.     Pupils: Pupils are equal, round, and reactive to light.  Neck:     Musculoskeletal: Normal range of motion and neck supple.     Thyroid: No thyromegaly.     Vascular: No JVD.  Cardiovascular:     Rate and Rhythm: Normal rate and regular rhythm.     Heart sounds: Normal heart sounds. No murmur. No friction rub. No gallop.   Pulmonary:     Effort: Pulmonary effort is normal.     Breath sounds: Normal breath sounds.  Abdominal:     General: Bowel sounds are normal.     Palpations: Abdomen is soft. There is no mass.     Tenderness: There is no abdominal tenderness. There is no guarding.  Musculoskeletal: Normal range of motion.  Lymphadenopathy:     Cervical: No cervical adenopathy.  Skin:    General: Skin is warm and dry.  Neurological:     Mental Status: She is alert.     Deep Tendon Reflexes: Reflexes are normal and symmetric.     BP 122/62   Pulse 80   Temp 98.8 F (37.1 C) (Oral)   Ht 5\' 3"  (1.6 m)   Wt 164 lb (74.4 kg)   BMI 29.05 kg/m   Assessment and Plan: 1. Breast cancer screening Patient scheduled for mammogram screening radiology.  2. sinusitis acute.  Persistent.  Azithromycin 250 mg 2 today followed by 1 a day for 4 days.

## 2018-09-23 DIAGNOSIS — R600 Localized edema: Secondary | ICD-10-CM | POA: Diagnosis not present

## 2018-09-23 DIAGNOSIS — Z950 Presence of cardiac pacemaker: Secondary | ICD-10-CM | POA: Diagnosis not present

## 2018-09-23 DIAGNOSIS — I10 Essential (primary) hypertension: Secondary | ICD-10-CM | POA: Diagnosis not present

## 2018-10-03 ENCOUNTER — Other Ambulatory Visit: Payer: Self-pay

## 2018-10-03 DIAGNOSIS — R05 Cough: Secondary | ICD-10-CM

## 2018-10-03 DIAGNOSIS — R059 Cough, unspecified: Secondary | ICD-10-CM

## 2018-10-03 MED ORDER — BENZONATATE 200 MG PO CAPS: 200.0000 mg | ORAL_CAPSULE | Freq: Two times a day (BID) | ORAL | 0 refills | Status: DC | PRN

## 2018-10-03 NOTE — Progress Notes (Unsigned)
Sent in tessalon perles

## 2018-10-13 DIAGNOSIS — K5901 Slow transit constipation: Secondary | ICD-10-CM | POA: Diagnosis not present

## 2018-10-13 DIAGNOSIS — N814 Uterovaginal prolapse, unspecified: Secondary | ICD-10-CM | POA: Diagnosis not present

## 2018-10-13 DIAGNOSIS — Z4689 Encounter for fitting and adjustment of other specified devices: Secondary | ICD-10-CM | POA: Diagnosis not present

## 2018-10-13 DIAGNOSIS — N952 Postmenopausal atrophic vaginitis: Secondary | ICD-10-CM | POA: Diagnosis not present

## 2018-12-01 ENCOUNTER — Other Ambulatory Visit: Payer: Self-pay | Admitting: Family Medicine

## 2018-12-01 DIAGNOSIS — Z1231 Encounter for screening mammogram for malignant neoplasm of breast: Secondary | ICD-10-CM

## 2018-12-01 NOTE — Progress Notes (Unsigned)
mammo sched for [place] on May 27th @10 :00

## 2019-01-28 ENCOUNTER — Ambulatory Visit: Payer: Medicare Other

## 2019-01-29 ENCOUNTER — Ambulatory Visit: Payer: Medicare Other

## 2019-02-04 ENCOUNTER — Other Ambulatory Visit: Payer: Self-pay

## 2019-02-04 ENCOUNTER — Ambulatory Visit
Admission: RE | Admit: 2019-02-04 | Discharge: 2019-02-04 | Disposition: A | Discharge status: Home / Self Care | Payer: Medicare Other | Source: Ambulatory Visit | Attending: Family Medicine | Admitting: Family Medicine

## 2019-02-04 DIAGNOSIS — Z1231 Encounter for screening mammogram for malignant neoplasm of breast: Secondary | ICD-10-CM | POA: Insufficient documentation

## 2019-02-06 DIAGNOSIS — N952 Postmenopausal atrophic vaginitis: Secondary | ICD-10-CM | POA: Diagnosis not present

## 2019-02-06 DIAGNOSIS — N814 Uterovaginal prolapse, unspecified: Secondary | ICD-10-CM | POA: Diagnosis not present

## 2019-02-06 DIAGNOSIS — Z4689 Encounter for fitting and adjustment of other specified devices: Secondary | ICD-10-CM | POA: Diagnosis not present

## 2019-03-10 DIAGNOSIS — I442 Atrioventricular block, complete: Secondary | ICD-10-CM | POA: Diagnosis not present

## 2019-04-14 DIAGNOSIS — Z950 Presence of cardiac pacemaker: Secondary | ICD-10-CM | POA: Diagnosis not present

## 2019-04-14 DIAGNOSIS — R600 Localized edema: Secondary | ICD-10-CM | POA: Diagnosis not present

## 2019-04-14 DIAGNOSIS — I10 Essential (primary) hypertension: Secondary | ICD-10-CM | POA: Diagnosis not present

## 2019-05-13 DIAGNOSIS — N952 Postmenopausal atrophic vaginitis: Secondary | ICD-10-CM | POA: Diagnosis not present

## 2019-05-13 DIAGNOSIS — N814 Uterovaginal prolapse, unspecified: Secondary | ICD-10-CM | POA: Diagnosis not present

## 2019-05-13 DIAGNOSIS — Z4689 Encounter for fitting and adjustment of other specified devices: Secondary | ICD-10-CM | POA: Diagnosis not present

## 2019-08-20 DIAGNOSIS — N814 Uterovaginal prolapse, unspecified: Secondary | ICD-10-CM | POA: Diagnosis not present

## 2019-08-20 DIAGNOSIS — Z4689 Encounter for fitting and adjustment of other specified devices: Secondary | ICD-10-CM | POA: Diagnosis not present

## 2019-08-20 DIAGNOSIS — N952 Postmenopausal atrophic vaginitis: Secondary | ICD-10-CM | POA: Diagnosis not present

## 2019-09-18 DIAGNOSIS — I442 Atrioventricular block, complete: Secondary | ICD-10-CM | POA: Diagnosis not present

## 2019-10-20 DIAGNOSIS — I10 Essential (primary) hypertension: Secondary | ICD-10-CM | POA: Diagnosis not present

## 2019-10-20 DIAGNOSIS — Z950 Presence of cardiac pacemaker: Secondary | ICD-10-CM | POA: Diagnosis not present

## 2019-11-18 DIAGNOSIS — N952 Postmenopausal atrophic vaginitis: Secondary | ICD-10-CM | POA: Diagnosis not present

## 2019-11-18 DIAGNOSIS — N814 Uterovaginal prolapse, unspecified: Secondary | ICD-10-CM | POA: Diagnosis not present

## 2019-11-18 DIAGNOSIS — Z4689 Encounter for fitting and adjustment of other specified devices: Secondary | ICD-10-CM | POA: Diagnosis not present

## 2019-12-22 DIAGNOSIS — I442 Atrioventricular block, complete: Secondary | ICD-10-CM | POA: Diagnosis not present

## 2019-12-24 ENCOUNTER — Telehealth: Payer: Self-pay | Admitting: Family Medicine

## 2019-12-24 NOTE — Telephone Encounter (Unsigned)
Copied from CRM [id number]. Topic: General - Other >> Dec 24, 2019 12:10 PM Tamela Oddi wrote: Reason for CRM: Patient called to inform the doctor that she has had both both shingles shots and has gotten both covid vaccines.

## 2019-12-28 ENCOUNTER — Ambulatory Visit (INDEPENDENT_AMBULATORY_CARE_PROVIDER_SITE_OTHER): Payer: Medicare Other

## 2019-12-28 ENCOUNTER — Other Ambulatory Visit: Payer: Self-pay

## 2019-12-28 VITALS — BP 142/78 | HR 78 | Temp 97.7°F | Resp 16 | Ht 63.0 in | Wt 152.4 lb

## 2019-12-28 DIAGNOSIS — Z1231 Encounter for screening mammogram for malignant neoplasm of breast: Secondary | ICD-10-CM | POA: Diagnosis not present

## 2019-12-28 DIAGNOSIS — Z0000 Encounter for general adult medical examination without abnormal findings: Secondary | ICD-10-CM | POA: Diagnosis not present

## 2019-12-28 NOTE — Patient Instructions (Signed)
[name] , Thank you for taking time to come for your Medicare Wellness Visit. I appreciate your ongoing commitment to your health goals. Please review the following plan we discussed and let me know if I can assist you in the future.   Screening recommendations/referrals: Colonoscopy: no longer required Mammogram: done 02/04/19. Please call [phone] to schedule your mammogram and bone density screening.  Recommended yearly ophthalmology/optometry visit for glaucoma screening and checkup Recommended yearly dental visit for hygiene and checkup  Vaccinations: Influenza vaccine: postponed Pneumococcal vaccine: postponed Tdap vaccine: due Shingles vaccine: Shingrix series completed 08/26/19 & 11/12/19 Covid-19: done 10/02/19 & 10/23/19  Advanced directives: Please bring a copy of your health care power of attorney and living will to the office at your convenience.  Conditions/risks identified: Recommend continuing to prevent falls  Next appointment: Please follow up in one year for your Medicare Annual Wellness visit.     Preventive Care 84 Years and Older, Female Preventive care refers to lifestyle choices and visits with your health care provider that can promote health and wellness. What does preventive care include?  A yearly physical exam. This is also called an annual well check.  Dental exams once or twice a year.  Routine eye exams. Ask your health care provider how often you should have your eyes checked.  Personal lifestyle choices, including:  Daily care of your teeth and gums.  Regular physical activity.  Eating a healthy diet.  Avoiding tobacco and drug use.  Limiting alcohol use.  Practicing safe sex.  Taking low-dose aspirin every day.  Taking vitamin and mineral supplements as recommended by your health care provider. What happens during an annual well check? The services and screenings done by your health care provider during your annual well check  will depend on your age, overall health, lifestyle risk factors, and family history of disease. Counseling  Your health care provider may ask you questions about your:  Alcohol use.  Tobacco use.  Drug use.  Emotional well-being.  Home and relationship well-being.  Sexual activity.  Eating habits.  History of falls.  Memory and ability to understand (cognition).  Work and work Astronomer.  Reproductive health. Screening  You may have the following tests or measurements:  Height, weight, and BMI.  Blood pressure.  Lipid and cholesterol levels. These may be checked every 5 years, or more frequently if you are over 33 years old.  Skin check.  Lung cancer screening. You may have this screening every year starting at age 56 if you have a 30-pack-year history of smoking and currently smoke or have quit within the past 15 years.  Fecal occult blood test (FOBT) of the stool. You may have this test every year starting at age 10.  Flexible sigmoidoscopy or colonoscopy. You may have a sigmoidoscopy every 5 years or a colonoscopy every 10 years starting at age 57.  Hepatitis C blood test.  Hepatitis B blood test.  Sexually transmitted disease (STD) testing.  Diabetes screening. This is done by checking your blood sugar (glucose) after you have not eaten for a while (fasting). You may have this done every 1-3 years.  Bone density scan. This is done to screen for osteoporosis. You may have this done starting at age 56.  Mammogram. This may be done every 1-2 years. Talk to your health care provider about how often you should have regular mammograms. Talk with your health care provider about your test results, treatment options, and if necessary, the need for more  tests. Vaccines  Your health care provider may recommend certain vaccines, such as:  Influenza vaccine. This is recommended every year.  Tetanus, diphtheria, and acellular pertussis (Tdap, Td) vaccine. You may  need a Td booster every 10 years.  Zoster vaccine. You may need this after age 48.  Pneumococcal 13-valent conjugate (PCV13) vaccine. One dose is recommended after age 42.  Pneumococcal polysaccharide (PPSV23) vaccine. One dose is recommended after age 24. Talk to your health care provider about which screenings and vaccines you need and how often you need them. This information is not intended to replace advice given to you by your health care provider. Make sure you discuss any questions you have with your health care provider. Document Released: 09/16/2015 Document Revised: 05/09/2016 Document Reviewed: 06/21/2015 Elsevier Interactive Patient Education  2017 [employer] Prevention in the Home Falls can cause injuries. They can happen to people of all ages. There are many things you can do to make your home safe and to help prevent falls. What can I do on the outside of my home?  Regularly fix the edges of walkways and driveways and fix any cracks.  Remove anything that might make you trip as you walk through a door, such as a raised step or threshold.  Trim any bushes or trees on the path to your home.  Use bright outdoor lighting.  Clear any walking paths of anything that might make someone trip, such as rocks or tools.  Regularly check to see if handrails are loose or broken. Make sure that both sides of any steps have handrails.  Any raised decks and porches should have guardrails on the edges.  Have any leaves, snow, or ice cleared regularly.  Use sand or salt on walking paths during winter.  Clean up any spills in your garage right away. This includes oil or grease spills. What can I do in the bathroom?  Use night lights.  Install grab bars by the toilet and in the tub and shower. Do not use towel bars as grab bars.  Use non-skid mats or decals in the tub or shower.  If you need to sit down in the shower, use a plastic, non-slip stool.  Keep the floor  dry. Clean up any water that spills on the floor as soon as it happens.  Remove soap buildup in the tub or shower regularly.  Attach bath mats securely with double-sided non-slip rug tape.  Do not have throw rugs and other things on the floor that can make you trip. What can I do in the bedroom?  Use night lights.  Make sure that you have a light by your bed that is easy to reach.  Do not use any sheets or blankets that are too big for your bed. They should not hang down onto the floor.  Have a firm chair that has side arms. You can use this for support while you get dressed.  Do not have throw rugs and other things on the floor that can make you trip. What can I do in the kitchen?  Clean up any spills right away.  Avoid walking on wet floors.  Keep items that you use a lot in easy-to-reach places.  If you need to reach something above you, use a strong step stool that has a grab bar.  Keep electrical cords out of the way.  Do not use floor polish or wax that makes floors slippery. If you must use wax, use non-skid floor  wax.  Do not have throw rugs and other things on the floor that can make you trip. What can I do with my stairs?  Do not leave any items on the stairs.  Make sure that there are handrails on both sides of the stairs and use them. Fix handrails that are broken or loose. Make sure that handrails are as long as the stairways.  Check any carpeting to make sure that it is firmly attached to the stairs. Fix any carpet that is loose or worn.  Avoid having throw rugs at the top or bottom of the stairs. If you do have throw rugs, attach them to the floor with carpet tape.  Make sure that you have a light switch at the top of the stairs and the bottom of the stairs. If you do not have them, ask someone to add them for you. What else can I do to help prevent falls?  Wear shoes that:  Do not have high heels.  Have rubber bottoms.  Are comfortable and fit you  well.  Are closed at the toe. Do not wear sandals.  If you use a stepladder:  Make sure that it is fully opened. Do not climb a closed stepladder.  Make sure that both sides of the stepladder are locked into place.  Ask someone to hold it for you, if possible.  Clearly mark and make sure that you can see:  Any grab bars or handrails.  First and last steps.  Where the edge of each step is.  Use tools that help you move around (mobility aids) if they are needed. These include:  Canes.  Walkers.  Scooters.  Crutches.  Turn on the lights when you go into a dark area. Replace any light bulbs as soon as they burn out.  Set up your furniture so you have a clear path. Avoid moving your furniture around.  If any of your floors are uneven, fix them.  If there are any pets around you, be aware of where they are.  Review your medicines with your doctor. Some medicines can make you feel dizzy. This can increase your chance of falling. Ask your doctor what other things that you can do to help prevent falls. This information is not intended to replace advice given to you by your health care provider. Make sure you discuss any questions you have with your health care provider. Document Released: 06/16/2009 Document Revised: 01/26/2016 Document Reviewed: 09/24/2014 Elsevier Interactive Patient Education  2017 [employer].

## 2019-12-28 NOTE — Progress Notes (Addendum)
Subjective:   Teresa Mays is a 86 y.o. female who presents for an Initial Medicare Annual preventative examination.  Review of Systems:   Cardiac Risk Factors include: advanced age (>48men, >31 women);hypertension     Objective:     Vitals: BP (!) 142/78 (BP Location: Right Arm, Patient Position: Sitting, Cuff Size: Normal)   Pulse 78   Temp 97.7 F (36.5 C) (Temporal)   Resp 16   Ht 5\' 3"  (1.6 m)   Wt 152 lb 6.4 oz (69.1 kg)   SpO2 97%   BMI 27.00 kg/m   Body mass index is 27 kg/m.  Advanced Directives 12/28/2019 09/07/2017  Does Patient Have a Medical Advance Directive? Yes Yes  Type of Paramedic of [place];Living will -  Copy of [place] in Chart? No - copy requested -    Tobacco Social History   Tobacco Use  Smoking Status Never Smoker  Smokeless Tobacco Never Used     Counseling given: Not Answered   Clinical Intake:  Pre-visit preparation completed: Yes  Pain : No/denies pain     BMI - recorded: 27 Nutritional Status: BMI 25 -29 Overweight Nutritional Risks: None Diabetes: No  How often do you need to have someone help you when you read instructions, pamphlets, or other written materials from your doctor or pharmacy?: 1 - Never  Interpreter Needed?: No  Information entered by :: Clemetine Marker LPN  Past Medical History:  Diagnosis Date  . Bladder prolapse, female, acquired   . Hypertension    Past Surgical History:  Procedure Laterality Date  . ABDOMINAL HYSTERECTOMY     40 years ago  . BREAST BIOPSY Left 1987   neg  . BREAST SURGERY  1990   Fibroadenom right   . COLONOSCOPY  2005  . PACEMAKER INSERTION  2003,2012   Family History  Problem Relation Age of Onset  . Breast cancer Neg Hx    Social History   Socioeconomic History  . Marital status: Widowed    Spouse name: Not on file  . Number of children: Not on file  . Years of education: Not on file  . Highest education  level: Bachelor's degree (e.g., BA, AB, BS)  Occupational History  . Occupation: retired  Tobacco Use  . Smoking status: Never Smoker  . Smokeless tobacco: Never Used  Substance and Sexual Activity  . Alcohol use: No    Alcohol/week: 0.0 standard drinks  . Drug use: No  . Sexual activity: Never  Other Topics Concern  . Not on file  Social History Narrative   Pt lives alone. Husband passed away in Aug 12, 2013.       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   Social Determinants of Health   Financial Resource Strain: Low Risk   . Difficulty of Paying Living Expenses: Not hard at all  Food Insecurity: No Food Insecurity  . Worried About Charity fundraiser in the Last Year: Never true  . Ran Out of Food in the Last Year: Never true  Transportation Needs: No Transportation Needs  . Lack of Transportation (Medical): No  . Lack of Transportation (Non-Medical): No  Physical Activity: Inactive  . Days of Exercise per Week: 0 days  . Minutes of Exercise per Session: 0 min  Stress: No Stress Concern Present  . Feeling of Stress : Not at all  Social Connections: Unknown  . Frequency of Communication with Friends and  Family: Patient refused  . Frequency of Social Gatherings with Friends and Family: Patient refused  . Attends Religious Services: Patient refused  . Active Member of Clubs or Organizations: Patient refused  . Attends Banker Meetings: Patient refused  . Marital Status: Widowed    Outpatient Encounter Medications as of 12/28/2019  Medication Sig  . NORVASC 5 MG tablet 1 tablet daily. Dr Darrold Junker  . OXYQUINOLONE SULFATE VAGINAL (TRIMO-SAN) 0.025 % GEL Place vaginally. Use vaginally 3 times per week  . [DISCONTINUED] benzonatate (TESSALON) 200 MG capsule Take 1 capsule (200 mg total) by mouth 2 (two) times daily as needed for cough.  . [DISCONTINUED] estradiol (ESTRACE) 0.1 MG/GM vaginal cream Place vaginally. Junious Silk  .  [DISCONTINUED] guaiFENesin-codeine (ROBITUSSIN AC) 100-10 MG/5ML syrup Take 5 mLs by mouth 3 (three) times daily as needed for cough.   Facility-Administered Encounter Medications as of 12/28/2019  Medication  . albuterol (PROVENTIL) (2.5 MG/3ML) 0.083% nebulizer solution 2.5 mg    Activities of Daily Living In your present state of health, do you have any difficulty performing the following activities: 12/28/2019  Hearing? N  Comment declines hearing aids  Vision? N  Difficulty concentrating or making decisions? N  Walking or climbing stairs? Y  Dressing or bathing? N  Doing errands, shopping? N  Preparing Food and eating ? N  Using the Toilet? N  In the past six months, have you accidently leaked urine? Y  Do you have problems with loss of bowel control? N  Managing your Medications? N  Managing your Finances? N  Housekeeping or managing your Housekeeping? N  Some recent data might be hidden    Patient Care Team: Duanne Limerick, MD as PCP - General (Family Medicine) Duanne Limerick, MD as Referring Physician (Family Medicine) Lemar Livings Merrily Pew, MD (General Surgery) Marcina Millard, MD as Consulting Physician (Cardiology)    Assessment:   This is a routine wellness examination for Teresa Mays.  Exercise Activities and Dietary recommendations Current Exercise Habits: The patient does not participate in regular exercise at present, Exercise limited by: orthopedic condition(s)  Goals    . Increase physical activity     Recommend increasing physical activity as tolerated        Fall Risk Fall Risk  12/28/2019 09/16/2018 06/14/2017 11/11/2015  Falls in the past year? 0 0 No No  Number falls in past yr: 0 - - -  Injury with Fall? 0 - - -  Risk for fall due to : Impaired balance/gait - - -  Follow up Falls prevention discussed Falls evaluation completed - -   FALL RISK PREVENTION PERTAINING TO THE HOME:  Any stairs in or around the home? No  If so, do they handrails? No    Home free of loose throw rugs in walkways, pet beds, electrical cords, etc? Yes  Adequate lighting in your home to reduce risk of falls? Yes   ASSISTIVE DEVICES UTILIZED TO PREVENT FALLS:  Life alert? No  Use of a cane, walker or w/c? Yes  Grab bars in the bathroom? No  Shower chair or bench in shower? Yes  Elevated toilet seat or a handicapped toilet? Yes   DME ORDERS:  DME order needed?  No   TIMED UP AND GO:  Was the test performed? Yes .  Length of time to ambulate 10 feet: 7 sec.   GAIT:  Appearance of gait: Gait slow, steady and  with the use of an assistive device.   Education: Fall  risk prevention has been discussed.  Intervention(s) required? No   Depression Screen PHQ 2/9 Scores 12/28/2019 06/14/2017 11/11/2015  PHQ - 2 Score 0 0 0  PHQ- 9 Score - 0 -     Cognitive Function     6CIT Screen 12/28/2019  What Year? 0 points  What month? 0 points  What time? 0 points  Count back from 20 0 points  Months in reverse 0 points  Repeat phrase 0 points  Total Score 0    Immunization History  Administered Date(s) Administered  . Influenza-Unspecified 06/17/2014  . PFIZER SARS-COV-2 Vaccination 10/02/2019, 10/23/2019  . Zoster Recombinat (Shingrix) 08/26/2019, 11/12/2019    Qualifies for Shingles Vaccine? Shingrix series completed.   Tdap: Although this vaccine is not a covered service during a Wellness Exam, does the patient still wish to receive this vaccine today?  No .  Education has been provided regarding the importance of this vaccine. Advised may receive this vaccine at local pharmacy or Health Dept. Aware to provide a copy of the vaccination record if obtained from local pharmacy or Health Dept. Verbalized acceptance and understanding.  Flu Vaccine: Due for Flu vaccine. Does the patient want to receive this vaccine today?  No . Education has been provided regarding the importance of this vaccine but still declined. Advised may receive this vaccine at  local pharmacy or Health Dept. Aware to provide a copy of the vaccination record if obtained from local pharmacy or Health Dept. Verbalized acceptance and understanding.  Pneumococcal Vaccine: Due for Pneumococcal vaccine. Does the patient want to receive this vaccine today?  No . Education has been provided regarding the importance of this vaccine but still declined. Advised may receive this vaccine at local pharmacy or Health Dept. Aware to provide a copy of the vaccination record if obtained from local pharmacy or Health Dept. Verbalized acceptance and understanding.   Screening Tests Health Maintenance  Topic Date Due  . TETANUS/TDAP  Never done  . DEXA SCAN  Never done  . PNA vac Low Risk Adult (1 of 2 - PCV13) Never done  . MAMMOGRAM  02/04/2020  . INFLUENZA VACCINE  04/03/2020  . COVID-19 Vaccine  Completed    Cancer Screenings:  Colorectal Screening: No longer required.   Mammogram: Completed 02/04/19. Repeat every year. Ordered today. Pt provided with contact information and advised to call to schedule appt.   Bone Density: Not completed. Ordered today. Pt provided with contact information and advised to call to schedule appt.   Lung Cancer Screening: (Low Dose CT Chest recommended if Age 62-80 years, 30 pack-year currently smoking OR have quit w/in 15years.) does not qualify.   Additional Screening:  Hepatitis C Screening: no longer required  Vision Screening: Recommended annual ophthalmology exams for early detection of glaucoma and other disorders of the eye. Is the patient up to date with their annual eye exam?  No  - postponed due to Covid Who is the provider or what is the name of the office in which the pt attends annual eye exams? [facility]  Dental Screening: Recommended annual dental exams for proper oral hygiene  Community Resource Referral:  CRR required this visit?  No       Plan:     I have personally reviewed and addressed the Medicare Annual  Wellness questionnaire and have noted the following in the patient's chart:  A. Medical and social history B. Use of alcohol, tobacco or illicit drugs  C. Current medications and supplements D.  Functional ability and status E.  Nutritional status F.  Physical activity G. Advance directives H. List of other physicians I.  Hospitalizations, surgeries, and ER visits in previous 12 months J.  Vitals K. Screenings such as hearing and vision if needed, cognitive and depression L. Referrals and appointments   In addition, I have reviewed and discussed with patient certain preventive protocols, quality metrics, and best practice recommendations. A written personalized care plan for preventive services as well as general preventive health recommendations were provided to patient.   Signed,  Reather Littler, LPN Nurse Health Advisor   Nurse Notes: pt advised due for OV with Dr. Yetta Barre. Pt requested mammogram and bone density screening. I attempted to put the order in but kept getting a system error. Please order for patient at next visit. Thank you!

## 2019-12-31 ENCOUNTER — Ambulatory Visit (INDEPENDENT_AMBULATORY_CARE_PROVIDER_SITE_OTHER): Payer: Medicare Other | Admitting: Family Medicine

## 2019-12-31 ENCOUNTER — Encounter: Payer: Self-pay | Admitting: Family Medicine

## 2019-12-31 ENCOUNTER — Other Ambulatory Visit: Payer: Self-pay

## 2019-12-31 VITALS — BP 122/62 | HR 72 | Ht 63.0 in | Wt 151.0 lb

## 2019-12-31 DIAGNOSIS — I10 Essential (primary) hypertension: Secondary | ICD-10-CM

## 2019-12-31 DIAGNOSIS — B0229 Other postherpetic nervous system involvement: Secondary | ICD-10-CM | POA: Diagnosis not present

## 2019-12-31 DIAGNOSIS — N1831 Chronic kidney disease, stage 3a: Secondary | ICD-10-CM

## 2019-12-31 DIAGNOSIS — Z0000 Encounter for general adult medical examination without abnormal findings: Secondary | ICD-10-CM

## 2019-12-31 NOTE — Progress Notes (Addendum)
Date:  12/31/2019   Name:  Teresa Mays   DOB:  [birth year]   MRN:  [id number]   Chief Complaint: bone density ordered  Patient is a 86 year old female who presents for a health maintenance exam. The patient reports the following problems: postherpetic neuralgia. Health maintenance has been reviewed up to date.   Lab Results  Component Value Date   CREATININE 0.94 08/12/2012   BUN 19 (H) 08/12/2012   NA 141 08/12/2012   K 4.0 08/12/2012   CL 103 08/12/2012   CO2 33 (H) 08/12/2012   No results found for: CHOL, HDL, LDLCALC, LDLDIRECT, TRIG, CHOLHDL No results found for: TSH No results found for: HGBA1C Lab Results  Component Value Date   WBC 5.2 08/16/2016   HGB 13.9 08/16/2016   HCT 41.9 08/16/2016   MCV 91.2 08/16/2016   PLT 122 (L) 08/16/2016   Lab Results  Component Value Date   ALT 24 08/12/2012   AST 24 08/12/2012   ALKPHOS 100 08/12/2012   BILITOT 0.8 08/12/2012     Review of Systems  Constitutional: Negative.  Negative for chills, fatigue, fever and unexpected weight change.  HENT: Negative for congestion, ear discharge, ear pain, rhinorrhea, sinus pressure, sneezing and sore throat.   Eyes: Negative for photophobia, pain, discharge, redness and itching.  Respiratory: Negative for apnea, cough, choking, chest tightness, shortness of breath, wheezing and stridor.   Cardiovascular: Negative for chest pain, palpitations and leg swelling.  Gastrointestinal: Negative for abdominal pain, blood in stool, constipation, diarrhea, nausea and vomiting.  Endocrine: Negative for cold intolerance, heat intolerance, polydipsia, polyphagia and polyuria.  Genitourinary: Negative for dysuria, flank pain, frequency, hematuria, menstrual problem, pelvic pain, urgency, vaginal bleeding and vaginal discharge.  Musculoskeletal: Negative for arthralgias, back pain and myalgias.  Skin: Negative for rash.  Allergic/Immunologic: Negative for environmental allergies and food  allergies.  Neurological: Negative for dizziness, weakness, light-headedness, numbness and headaches.  Hematological: Negative for adenopathy. Does not bruise/bleed easily.  Psychiatric/Behavioral: Negative for dysphoric mood. The patient is not nervous/anxious.     Patient Active Problem List   Diagnosis Date Noted  . Mastalgia 04/25/2016  . Bladder cystocele 11/11/2015  . BP (high blood pressure) 01/27/2014  . Encounter for checking and testing of cardiac pacemaker pulse generator (battery) 01/27/2014    Allergies  Allergen Reactions  . Epinephrine   . Mirabegron     Other reaction(s): Dizziness Constipation, headache, high-blood pressure, exhaustion, blurred vision, GI upset.  . Sulfa Antibiotics Rash    Past Surgical History:  Procedure Laterality Date  . ABDOMINAL HYSTERECTOMY     40 years ago  . BREAST BIOPSY Left 1987   neg  . BREAST SURGERY  1990   Fibroadenom right   . COLONOSCOPY  2005  . PACEMAKER INSERTION  [id number]    Social History   Tobacco Use  . Smoking status: Never Smoker  . Smokeless tobacco: Never Used  Substance Use Topics  . Alcohol use: No    Alcohol/week: 0.0 standard drinks  . Drug use: No     Medication list has been reviewed and updated.  Current Meds  Medication Sig  . NORVASC 5 MG tablet 1 tablet daily. Dr Darrold Junker  . OXYQUINOLONE SULFATE VAGINAL (TRIMO-SAN) 0.025 % GEL Place vaginally. Use vaginally 3 times per week   Current Facility-Administered Medications for the 12/31/19 encounter (Office Visit) with Duanne Limerick, MD  Medication  . albuterol (PROVENTIL) (2.5 MG/3ML) 0.083% nebulizer solution 2.5 mg  PHQ 2/9 Scores 12/31/2019 12/28/2019 06/14/2017 11/11/2015  PHQ - 2 Score 0 0 0 0  PHQ- 9 Score 0 - 0 -    BP Readings from Last 3 Encounters:  12/31/19 122/62  12/28/19 (!) 142/78  09/16/18 122/62    Physical Exam Vitals and nursing note reviewed.  Constitutional:      General: She is not in acute distress.     Appearance: She is not diaphoretic.  HENT:     Head: Normocephalic and atraumatic.     Right Ear: Tympanic membrane, ear canal and external ear normal.     Left Ear: Tympanic membrane, ear canal and external ear normal.     Nose: Nose normal. No congestion or rhinorrhea.  Eyes:     General:        Right eye: No discharge.        Left eye: No discharge.     Conjunctiva/sclera: Conjunctivae normal.     Pupils: Pupils are equal, round, and reactive to light.  Neck:     Thyroid: No thyromegaly.     Vascular: No JVD.  Cardiovascular:     Rate and Rhythm: Normal rate and regular rhythm.     Chest Wall: PMI is not displaced.     Pulses: Normal pulses.     Heart sounds: Normal heart sounds, S1 normal and S2 normal. No murmur. No systolic murmur. No diastolic murmur. No friction rub. No gallop. No S3 or S4 sounds.   Pulmonary:     Effort: Pulmonary effort is normal.     Breath sounds: Normal breath sounds. No wheezing, rhonchi or rales.  Abdominal:     General: Bowel sounds are normal.     Palpations: Abdomen is soft. There is no mass.     Tenderness: There is no abdominal tenderness. There is no guarding.  Musculoskeletal:        General: Normal range of motion.     Cervical back: Normal range of motion and neck supple.  Lymphadenopathy:     Cervical: No cervical adenopathy.  Skin:    General: Skin is warm and dry.  Neurological:     Mental Status: She is alert.     Deep Tendon Reflexes: Reflexes are normal and symmetric.     Wt Readings from Last 3 Encounters:  12/31/19 151 lb (68.5 kg)  12/28/19 152 lb 6.4 oz (69.1 kg)  09/16/18 164 lb (74.4 kg)    BP 122/62   Pulse 72   Ht 5\' 3"  (1.6 m)   Wt 151 lb (68.5 kg)   BMI 26.75 kg/m   Assessment and Plan: 1. Health maintenance examination No subjective/objective concerns noted during history and physical exam.  Patient's chart was reviewed for previous encounters as well as most recent labs, most recent imaging, and care  everywhere.  Will check renal function panel for evaluation of GFR. - Renal Function Panel  2. Postherpetic neuralgia Patient is considered to have postherpetic neuralgia because of the discomfort she has had a previous infection of an area.  I have told the patient that this may be ongoing to some extent and at some point in time we may need to use gabapentin for control and she would like to wait at this time and use it when absolutely necessary.  3. ckd/3a 4. essential hypertension/

## 2019-12-31 NOTE — Patient Instructions (Addendum)
Diclofenac skin gel What is this medicine? DICLOFENAC (dye KLOE fen ak) is a non-steroidal anti-inflammatory drug (NSAID). The 1% skin gel is used to treat osteoarthritis of the hands or knees. The 3% skin gel is used to treat actinic keratosis. This medicine may be used for other purposes; ask your health care provider or pharmacist if you have questions. COMMON BRAND NAME(S): DSG Pak, Omeca, Solaravix, Solaraze, Voltaren Arthritis, Voltaren Gel What should I tell my health care provider before I take this medicine? They need to know if you have any of these conditions:  asthma  bleeding problems  coronary artery bypass graft (CABG) surgery within the past 2 weeks  heart disease  high blood pressure  if you frequently drink alcohol containing drinks  kidney disease  liver disease  open or infected skin  stomach problems  an unusual or allergic reaction to diclofenac, aspirin, other NSAIDs, other medicines, benzyl alcohol (3% gel only), foods, dyes, or preservatives  pregnant or trying to get pregnant  breast-feeding How should I use this medicine? This medicine is for external use only. Follow the directions on the prescription label. Wash hands before and after use. Do not get this medicine in your eyes. If you do, rinse out with plenty of cool tap water. Use your doses at regular intervals. Do not use your medicine more often than directed. A special MedGuide will be given to you by the pharmacist with each prescription and refill of the 1% gel. Be sure to read this information carefully each time. Talk to your pediatrician regarding the use of this medicine in children. Special care may be needed. The 3% gel is not approved for use in children. Overdosage: If you think you have taken too much of this medicine contact a poison control center or emergency room at once. NOTE: This medicine is only for you. Do not share this medicine with others. What if I miss a dose? If you  miss a dose, use it as soon as you can. If it is almost time for your next dose, use only that dose. Do not use double or extra doses. What may interact with this medicine?  aspirin  NSAIDs, medicines for pain and inflammation, like ibuprofen or naproxen Do not use any other skin products without telling your doctor or health care professional. This list may not describe all possible interactions. Give your health care provider a list of all the medicines, herbs, non-prescription drugs, or dietary supplements you use. Also tell them if you smoke, drink alcohol, or use illegal drugs. Some items may interact with your medicine. What should I watch for while using this medicine? Tell your doctor or healthcare provider if your symptoms do not start to get better or if they get worse. You will need to follow up with your healthcare provider to monitor your progress. You may need to be treated for up to 3 months if you are using the 3% gel, but the full effect may not occur until 1 month after stopping treatment. If you develop a severe skin reaction, contact your doctor or healthcare provider immediately. This medicine may cause serious skin reactions. They can happen weeks to months after starting the medicine. Contact your healthcare provider right away if you notice fevers or flu-like symptoms with a rash. The rash may be red or purple and then turn into blisters or peeling of the skin. Or, you might notice a red rash with swelling of the face, lips or lymph nodes in  your neck or under your arms. This medicine can make you more sensitive to the sun. Keep out of the sun. If you cannot avoid being in the sun, wear protective clothing and use sunscreen. Do not use sun lamps or tanning beds/booths. Do not take medicines such as ibuprofen and naproxen with this medicine. Side effects such as stomach upset, nausea, or ulcers may be more likely to occur. Many medicines available without a prescription should not  be taken with this medicine. This medicine does not prevent heart attack or stroke. In fact, this medicine may increase the chance of a heart attack or stroke. The chance may increase with longer use of this medicine and in people who have heart disease. If you take aspirin to prevent heart attack or stroke, talk with your doctor or healthcare provider. This medicine can cause ulcers and bleeding in the stomach and intestines at any time during treatment. Do not smoke cigarettes or drink alcohol. These increase irritation to your stomach and can make it more susceptible to damage from this medicine. Ulcers and bleeding can happen without warning symptoms and can cause death. You may get drowsy or dizzy. Do not drive, use machinery, or do anything that needs mental alertness until you know how this medicine affects you. Do not stand or sit up quickly, especially if you are an older patient. This reduces the risk of dizzy or fainting spells. This medicine can cause you to bleed more easily. Try to avoid damage to your teeth and gums when you brush or floss your teeth. What side effects may I notice from receiving this medicine? Side effects that you should report to your doctor or health care professional as soon as possible:  allergic reactions like skin rash, itching or hives, swelling of the face, lips, or tongue  black or bloody stools, blood in the urine or vomit  blurred vision  chest pain  difficulty breathing or wheezing  nausea or vomiting  rash, fever, and swollen lymph nodes  redness, blistering, peeling or loosening of the skin, including inside the mouth  slurred speech or weakness on one side of the body  trouble passing urine or change in the amount of urine  unexplained weight gain or swelling  unusually weak or tired  yellowing of eyes or skin Side effects that usually do not require medical attention (report to your doctor or health care professional if they continue  or are bothersome):  dizziness  dry skin  headache  heartburn  increased sensitivity to the sun  stomach pain  tingling at the application site This list may not describe all possible side effects. Call your doctor for medical advice about side effects. You may report side effects to FDA at 1-800-FDA-1088. Where should I keep my medicine? Keep out of the reach of children. Store the 1% gel at room temperature between 15 and 30 degrees C (59 and 86 degrees F). Store the 3% gel at room temperature between 20 and 25 degrees C (68 and 77 degrees F). Protect from light. Throw away any unused medicine after the expiration date. NOTE: This sheet is a summary. It may not cover all possible information. If you have questions about this medicine, talk to your doctor, pharmacist, or health care provider.  2020 Elsevier/Gold Standard (2018-11-05 13:05:18)  Postherpetic Neuralgia Postherpetic neuralgia (PHN) is nerve pain that occurs after a shingles infection. Shingles is a painful rash that appears on one area of the body, usually on the trunk or  face. Shingles is caused by the varicella-zoster virus. This is the same virus that causes chickenpox. In people who have had chickenpox, the virus can resurface years later and cause shingles. You may have PHN if you continue to have pain for 4 months after your shingles rash has gone away. PHN appears in the same area where you had the shingles rash. The pain usually goes away after the rash disappears. Getting a vaccination for shingles can prevent PHN. This vaccine is recommended for people older than 60. It may prevent shingles, and may also lower your risk of PHN if you do get shingles. What are the causes? This condition is caused by damage to your nerves from the varicella-zoster virus. The damage makes your nerves overly sensitive. What increases the risk? The following factors may make you more likely to develop this condition:  Being older  than 84 years of age.  Having severe pain before your shingles rash starts.  Having a severe rash.  Having shingles in and around the eye area.  Having a disease that makes your body unable to fight infections (weak immune system). What are the signs or symptoms? The main symptom of this condition is pain. The pain may:  Often be very bad and may be described as stabbing, burning, or feeling like an electric shock.  Come and go or may be there all the time.  Be triggered by light touches on the skin or changes in temperature. You may have itching along with the pain. How is this diagnosed? This condition may be diagnosed based on your symptoms and your history of shingles. Lab studies and other diagnostic tests are usually not needed. How is this treated? There is no cure for this condition. Treatment for PHN will focus on pain relief. Over-the-counter pain relievers do not usually relieve PHN pain. You may need to work with a pain specialist. Treatment may include:  Antidepressant medicines to help with pain and improve sleep.  Anti-seizure medicines to relieve nerve pain.  Strong pain relievers (opioids).  A numbing patch worn on the skin (lidocaine patch).  Botox (botulinum toxin) injections to block pain signals between nerves and muscles.  Injections of numbing medicine or anti-inflammatory medicines around irritated nerves. Follow these instructions at home:   It may take a long time to recover from PHN. Work closely with your health care provider and develop a good support system at home.  Take over-the-counter and prescription medicines only as told by your health care provider.  Do not drive or use heavy machinery while taking prescription pain medicine.  Wear loose, comfortable clothing.  Cover sensitive areas with a dressing to reduce friction from clothing rubbing on the area.  If directed, put ice on the painful area: ? Put ice in a plastic bag. ? Place a  towel between your skin and the bag. ? Leave the ice on for 20 minutes, 2-3 times a day.  Talk to your health care provider if you feel depressed or desperate. Living with long-term pain can be depressing.  Keep all follow-up visits as told by your health care provider. This is important. Contact a health care provider if:  Your medicine is not helping.  You are struggling to manage your pain at home. Summary  Postherpetic neuralgia is a very painful disorder that can occur after an episode of shingles.  The pain is often severe, burning, electric, or stabbing.  Prescription medicines can be helpful in managing persistent pain.  Getting a  vaccination for shingles can prevent PHN. This vaccine is recommended for people older than 60. This information is not intended to replace advice given to you by your health care provider. Make sure you discuss any questions you have with your health care provider. Document Revised: 08/02/2017 Document Reviewed: 11/06/2016 Elsevier Patient Education  [place].

## 2020-01-01 LAB — RENAL FUNCTION PANEL
Albumin: 4.6 g/dL (ref 3.5–4.6)
BUN/Creatinine Ratio: 18 (ref 12–28)
BUN: 19 mg/dL (ref 10–36)
CO2: 23 mmol/L (ref 20–29)
Calcium: 10 mg/dL (ref 8.7–10.3)
Chloride: 102 mmol/L (ref 96–106)
Creatinine, Ser: 1.06 mg/dL — ABNORMAL HIGH (ref 0.57–1.00)
GFR calc Af Amer: 53 mL/min/{1.73_m2} — ABNORMAL LOW (ref >59)
GFR calc non Af Amer: 46 mL/min/{1.73_m2} — ABNORMAL LOW (ref >59)
Glucose: 90 mg/dL (ref 65–99)
Phosphorus: 3.7 mg/dL (ref 3.0–4.3)
Potassium: 4.4 mmol/L (ref 3.5–5.2)
Sodium: 141 mmol/L (ref 134–144)

## 2020-01-22 ENCOUNTER — Telehealth: Payer: Self-pay | Admitting: Family Medicine

## 2020-01-22 NOTE — Telephone Encounter (Signed)
Copied from CRM [id number]. Topic: General - Call Back - No Documentation >> Jan 22, 2020 11:25 AM Randol Kern wrote: Reason for CRM: Pt called requesting a call back from Amy regarding insurance. Tried calling office, please advise Best contact: [phone] "They have denied my bloodwork payment, chemistry and pathology"

## 2020-01-22 NOTE — Telephone Encounter (Signed)
Please call pt

## 2020-02-29 DIAGNOSIS — Z4689 Encounter for fitting and adjustment of other specified devices: Secondary | ICD-10-CM | POA: Diagnosis not present

## 2020-02-29 DIAGNOSIS — N952 Postmenopausal atrophic vaginitis: Secondary | ICD-10-CM | POA: Diagnosis not present

## 2020-02-29 DIAGNOSIS — N814 Uterovaginal prolapse, unspecified: Secondary | ICD-10-CM | POA: Diagnosis not present

## 2020-03-22 DIAGNOSIS — I442 Atrioventricular block, complete: Secondary | ICD-10-CM | POA: Diagnosis not present

## 2020-04-12 DIAGNOSIS — Z45010 Encounter for checking and testing of cardiac pacemaker pulse generator [battery]: Secondary | ICD-10-CM | POA: Diagnosis not present

## 2020-04-12 DIAGNOSIS — R600 Localized edema: Secondary | ICD-10-CM | POA: Diagnosis not present

## 2020-04-12 DIAGNOSIS — I10 Essential (primary) hypertension: Secondary | ICD-10-CM | POA: Diagnosis not present

## 2020-05-13 DIAGNOSIS — H2513 Age-related nuclear cataract, bilateral: Secondary | ICD-10-CM | POA: Diagnosis not present

## 2020-06-02 DIAGNOSIS — N952 Postmenopausal atrophic vaginitis: Secondary | ICD-10-CM | POA: Diagnosis not present

## 2020-06-02 DIAGNOSIS — N814 Uterovaginal prolapse, unspecified: Secondary | ICD-10-CM | POA: Diagnosis not present

## 2020-06-02 DIAGNOSIS — Z4689 Encounter for fitting and adjustment of other specified devices: Secondary | ICD-10-CM | POA: Diagnosis not present

## 2020-06-21 DIAGNOSIS — H524 Presbyopia: Secondary | ICD-10-CM | POA: Diagnosis not present

## 2020-06-21 DIAGNOSIS — I442 Atrioventricular block, complete: Secondary | ICD-10-CM | POA: Diagnosis not present

## 2020-07-13 ENCOUNTER — Other Ambulatory Visit: Payer: Self-pay | Admitting: Family Medicine

## 2020-07-13 DIAGNOSIS — Z1231 Encounter for screening mammogram for malignant neoplasm of breast: Secondary | ICD-10-CM

## 2020-07-26 DIAGNOSIS — J9811 Atelectasis: Secondary | ICD-10-CM | POA: Diagnosis not present

## 2020-07-26 DIAGNOSIS — I10 Essential (primary) hypertension: Secondary | ICD-10-CM | POA: Diagnosis not present

## 2020-07-26 DIAGNOSIS — Z23 Encounter for immunization: Secondary | ICD-10-CM | POA: Diagnosis not present

## 2020-07-26 DIAGNOSIS — Z01818 Encounter for other preprocedural examination: Secondary | ICD-10-CM | POA: Diagnosis not present

## 2020-07-26 DIAGNOSIS — Z45010 Encounter for checking and testing of cardiac pacemaker pulse generator [battery]: Secondary | ICD-10-CM | POA: Diagnosis not present

## 2020-08-08 ENCOUNTER — Other Ambulatory Visit: Payer: Self-pay

## 2020-08-08 ENCOUNTER — Other Ambulatory Visit
Admission: RE | Admit: 2020-08-08 | Discharge: 2020-08-08 | Disposition: A | Discharge status: Home / Self Care | Payer: Medicare Other | Source: Ambulatory Visit | Attending: Obstetrics and Gynecology | Admitting: Obstetrics and Gynecology

## 2020-08-08 DIAGNOSIS — Z20822 Contact with and (suspected) exposure to covid-19: Secondary | ICD-10-CM | POA: Insufficient documentation

## 2020-08-08 DIAGNOSIS — Z01812 Encounter for preprocedural laboratory examination: Secondary | ICD-10-CM | POA: Insufficient documentation

## 2020-08-09 LAB — SARS CORONAVIRUS 2 (TAT 6-24 HRS): SARS Coronavirus 2: NEGATIVE

## 2020-08-10 ENCOUNTER — Observation Stay
Admission: RE | Admit: 2020-08-10 | Discharge: 2020-08-11 | Disposition: A | Discharge status: Home / Self Care | Payer: Medicare Other | Attending: Cardiology | Admitting: Cardiology

## 2020-08-10 ENCOUNTER — Encounter
Admission: RE | Disposition: A | Discharge status: Home / Self Care | Payer: Self-pay | Source: Home / Self Care | Attending: Cardiology

## 2020-08-10 ENCOUNTER — Other Ambulatory Visit: Payer: Self-pay

## 2020-08-10 ENCOUNTER — Ambulatory Visit: Payer: Medicare Other

## 2020-08-10 ENCOUNTER — Encounter: Payer: Self-pay | Admitting: Cardiology

## 2020-08-10 ENCOUNTER — Ambulatory Visit: Admit: 2020-08-10 | Payer: Medicare Other | Admitting: Cardiology

## 2020-08-10 DIAGNOSIS — I442 Atrioventricular block, complete: Secondary | ICD-10-CM | POA: Diagnosis not present

## 2020-08-10 DIAGNOSIS — Z006 Encounter for examination for normal comparison and control in clinical research program: Secondary | ICD-10-CM | POA: Diagnosis not present

## 2020-08-10 DIAGNOSIS — Z79899 Other long term (current) drug therapy: Secondary | ICD-10-CM | POA: Diagnosis not present

## 2020-08-10 DIAGNOSIS — Z45018 Encounter for adjustment and management of other part of cardiac pacemaker: Secondary | ICD-10-CM

## 2020-08-10 DIAGNOSIS — I10 Essential (primary) hypertension: Secondary | ICD-10-CM | POA: Diagnosis not present

## 2020-08-10 HISTORY — PX: PACEMAKER LEADLESS INSERTION: EP1219

## 2020-08-10 HISTORY — PX: PPM GENERATOR REMOVAL: EP1234

## 2020-08-10 SURGERY — PACEMAKER LEADLESS INSERTION
Anesthesia: Moderate Sedation

## 2020-08-10 SURGERY — PPM GENERATOR REMOVAL

## 2020-08-10 MED ORDER — ONDANSETRON HCL 4 MG/2ML IJ SOLN: 4.0000 mg | Freq: Four times a day (QID) | INTRAMUSCULAR | Status: DC | PRN

## 2020-08-10 MED ORDER — MIDAZOLAM HCL 2 MG/2ML IJ SOLN
INTRAMUSCULAR | Status: AC
  Filled 2020-08-10: qty 2

## 2020-08-10 MED ORDER — CEFAZOLIN SODIUM-DEXTROSE 2-4 GM/100ML-% IV SOLN
2.0000 g | INTRAVENOUS | Status: AC
  Administered 2020-08-10: 2 g via INTRAVENOUS

## 2020-08-10 MED ORDER — MIDAZOLAM HCL 2 MG/2ML IJ SOLN
INTRAMUSCULAR | Status: DC | PRN
  Administered 2020-08-10: 1 mg via INTRAVENOUS

## 2020-08-10 MED ORDER — SODIUM CHLORIDE 0.9% FLUSH: 3.0000 mL | INTRAVENOUS | Status: DC | PRN

## 2020-08-10 MED ORDER — LIDOCAINE HCL (PF) 1 % IJ SOLN
INTRAMUSCULAR | Status: DC | PRN
  Administered 2020-08-10: 20 mL

## 2020-08-10 MED ORDER — SODIUM CHLORIDE 0.9 % IV SOLN: INTRAVENOUS | Status: DC

## 2020-08-10 MED ORDER — FENTANYL CITRATE (PF) 100 MCG/2ML IJ SOLN
INTRAMUSCULAR | Status: AC
  Filled 2020-08-10: qty 2

## 2020-08-10 MED ORDER — FENTANYL CITRATE (PF) 100 MCG/2ML IJ SOLN
INTRAMUSCULAR | Status: DC | PRN
  Administered 2020-08-10: 50 ug via INTRAVENOUS

## 2020-08-10 MED ORDER — ONDANSETRON HCL 4 MG/2ML IJ SOLN
INTRAMUSCULAR | Status: AC
  Filled 2020-08-10: qty 2

## 2020-08-10 MED ORDER — SODIUM CHLORIDE 0.9% FLUSH
3.0000 mL | Freq: Two times a day (BID) | INTRAVENOUS | Status: DC
  Administered 2020-08-10 (×2): 3 mL via INTRAVENOUS

## 2020-08-10 MED ORDER — LIDOCAINE HCL (PF) 1 % IJ SOLN
INTRAMUSCULAR | Status: AC
  Filled 2020-08-10: qty 60

## 2020-08-10 MED ORDER — HEPARIN SODIUM (PORCINE) 1000 UNIT/ML IJ SOLN
INTRAMUSCULAR | Status: DC | PRN
  Administered 2020-08-10: 4000 [IU] via INTRAVENOUS

## 2020-08-10 MED ORDER — SODIUM CHLORIDE 0.9 % IV SOLN: 250.0000 mL | INTRAVENOUS | Status: DC | PRN

## 2020-08-10 MED ORDER — ACETAMINOPHEN 325 MG PO TABS
ORAL_TABLET | ORAL | Status: AC
  Filled 2020-08-10: qty 2

## 2020-08-10 MED ORDER — ONDANSETRON HCL 4 MG/2ML IJ SOLN
4.0000 mg | Freq: Four times a day (QID) | INTRAMUSCULAR | Status: DC | PRN
  Administered 2020-08-10: 4 mg via INTRAVENOUS

## 2020-08-10 MED ORDER — HEPARIN (PORCINE) IN NACL 1000-0.9 UT/500ML-% IV SOLN
INTRAVENOUS | Status: DC | PRN
  Administered 2020-08-10 (×2): 500 mL

## 2020-08-10 MED ORDER — CEFAZOLIN SODIUM-DEXTROSE 1-4 GM/50ML-% IV SOLN
1.0000 g | Freq: Four times a day (QID) | INTRAVENOUS | Status: AC
  Administered 2020-08-10 – 2020-08-11 (×2): 1 g via INTRAVENOUS
  Filled 2020-08-10 (×2): qty 50

## 2020-08-10 MED ORDER — FENTANYL CITRATE (PF) 100 MCG/2ML IJ SOLN
INTRAMUSCULAR | Status: DC | PRN
  Administered 2020-08-10: 25 ug via INTRAVENOUS

## 2020-08-10 MED ORDER — SODIUM CHLORIDE 0.45 % IV SOLN: INTRAVENOUS | Status: DC

## 2020-08-10 MED ORDER — AMLODIPINE BESYLATE 5 MG PO TABS
2.5000 mg | ORAL_TABLET | Freq: Every day | ORAL | Status: DC
  Administered 2020-08-10: 2.5 mg via ORAL
  Filled 2020-08-10: qty 1

## 2020-08-10 MED ORDER — ACETAMINOPHEN 325 MG PO TABS
650.0000 mg | ORAL_TABLET | ORAL | Status: DC | PRN
  Administered 2020-08-10: 650 mg via ORAL
  Filled 2020-08-10: qty 2

## 2020-08-10 MED ORDER — ACETAMINOPHEN 325 MG PO TABS
325.0000 mg | ORAL_TABLET | ORAL | Status: DC | PRN
  Administered 2020-08-10: 650 mg via ORAL

## 2020-08-10 MED ORDER — IOHEXOL 300 MG/ML  SOLN
INTRAMUSCULAR | Status: DC | PRN
  Administered 2020-08-10: 10 mL

## 2020-08-10 MED ORDER — SODIUM CHLORIDE 0.9 % IV SOLN
80.0000 mg | INTRAVENOUS | Status: AC
  Administered 2020-08-10: 80 mg
  Filled 2020-08-10: qty 80

## 2020-08-10 MED ORDER — HEPARIN SODIUM (PORCINE) 1000 UNIT/ML IJ SOLN
INTRAMUSCULAR | Status: AC
  Filled 2020-08-10: qty 1

## 2020-08-10 MED ORDER — HEPARIN (PORCINE) IN NACL 1000-0.9 UT/500ML-% IV SOLN
INTRAVENOUS | Status: AC
  Filled 2020-08-10: qty 1000

## 2020-08-10 SURGICAL SUPPLY — 14 items
CATH INFINITI JR4 5F (CATHETERS) ×2 IMPLANT
DILATOR VESSEL 38 20CM 12FR (INTRODUCER) ×2 IMPLANT
DILATOR VESSEL 38 20CM 14FR (INTRODUCER) ×2 IMPLANT
DILATOR VESSEL 38 20CM 18FR (INTRODUCER) ×2 IMPLANT
DILATOR VESSEL 38 20CM 8FR (INTRODUCER) ×2 IMPLANT
GUIDEWIRE SUPER STIFF .035X180 (WIRE) ×2 IMPLANT
MICRA AV TRANSCATH PACING SYS (Pacemaker) ×2 IMPLANT
MICRA INTRODUCER SHEATH (SHEATH) ×2
NEEDLE PERC 18GX7CM (NEEDLE) ×2 IMPLANT
PACK CARDIAC CATH (CUSTOM PROCEDURE TRAY) ×2 IMPLANT
SHEATH AVANTI 7FRX11 (SHEATH) ×2 IMPLANT
SHEATH INTRODUCER MICRA (SHEATH) ×1 IMPLANT
SYSTEM PACING TRNSCTH AV MICRA (Pacemaker) ×1 IMPLANT
WIRE GUIDERIGHT .035X150 (WIRE) ×2 IMPLANT

## 2020-08-10 SURGICAL SUPPLY — 7 items
DEVICE DSSCT PLSMBLD 3.0S LGHT (MISCELLANEOUS) ×2 IMPLANT
KIT LEAD END CAP (Cap) ×4 IMPLANT
KIT WRENCH (KITS) ×3 IMPLANT
LEAD END CAP (Cap) ×2 IMPLANT
PACK PACE INSERTION (MISCELLANEOUS) ×3 IMPLANT
PAD ELECT DEFIB RADIOL ZOLL (MISCELLANEOUS) ×3 IMPLANT
PLASMABLADE 3.0S W/LIGHT (MISCELLANEOUS) ×3

## 2020-08-10 NOTE — Progress Notes (Signed)
Patient c/o nausea, no emesis and discomfort near removal of pacemaker site:  Left upper chest wall 3/10.   Administered 4mg  IV zofran SIVP and 650mg  po tylenol:  Both effective Tolerating gingerale and crackers without event. Son at bedside, continue to monitor

## 2020-08-10 NOTE — Progress Notes (Signed)
Patient to be admitted, procedures today:  Leadless pacemaker placed, right femoral approach, dressing c/d/i no s/s hematoma noted. Left chest wall medtronic pacemaker removed, wires capped. Dressing telfa with opsite, noted old drainage on dressing. Patient tolerating sandwich, gingerale. Son at bedside, pt without c/o's at this time.

## 2020-08-10 NOTE — Progress Notes (Signed)
Patient arrived to specials for placement of leadless pacemaker. Does have implanted device presently:  Medtronic. Patient is awake/alert x4, very active.  Verbalizes understanding of procedure, possible admit after procedure per provider and patient.  Lungs CTA, murmur auscultated.   No c/o's

## 2020-08-10 NOTE — Progress Notes (Signed)
Report given to [employer]. Pt awake/alert x4, no c/o's

## 2020-08-11 ENCOUNTER — Encounter: Payer: Self-pay | Admitting: Cardiology

## 2020-08-11 DIAGNOSIS — Z79899 Other long term (current) drug therapy: Secondary | ICD-10-CM | POA: Diagnosis not present

## 2020-08-11 DIAGNOSIS — Z006 Encounter for examination for normal comparison and control in clinical research program: Secondary | ICD-10-CM | POA: Diagnosis not present

## 2020-08-11 DIAGNOSIS — I10 Essential (primary) hypertension: Secondary | ICD-10-CM | POA: Diagnosis not present

## 2020-08-11 DIAGNOSIS — I442 Atrioventricular block, complete: Secondary | ICD-10-CM | POA: Diagnosis not present

## 2020-08-11 LAB — BASIC METABOLIC PANEL
Anion gap: 7 (ref 5–15)
BUN: 19 mg/dL (ref 8–23)
CO2: 26 mmol/L (ref 22–32)
Calcium: 8.8 mg/dL — ABNORMAL LOW (ref 8.9–10.3)
Chloride: 105 mmol/L (ref 98–111)
Creatinine, Ser: 0.98 mg/dL (ref 0.44–1.00)
GFR, Estimated: 55 mL/min — ABNORMAL LOW (ref >60)
Glucose, Bld: 95 mg/dL (ref 70–99)
Potassium: 3.9 mmol/L (ref 3.5–5.1)
Sodium: 138 mmol/L (ref 135–145)

## 2020-08-11 MED ORDER — CEPHALEXIN 250 MG PO CAPS: 500.0000 mg | ORAL_CAPSULE | Freq: Two times a day (BID) | ORAL | 0 refills | Status: DC

## 2020-08-11 NOTE — Discharge Summary (Signed)
Physician Discharge Summary  Patient ID: Teresa Mays MRN: [id number] DOB/AGE: 1928/08/28 86 y.o.  Admit date: 08/10/2020 Discharge date: 08/11/2020  Primary Discharge Diagnosis complete heart block Secondary Discharge Diagnosis pacemaker elective replacement indication  Significant Diagnostic Studies: yes  Consults: None  Hospital Course: The patient underwent successful Micra AV leadless pacemaker implantation via right femoral vein on 08/10/2020. The existing pacemaker generator was explanted. Patient was returned to telemetry where she had an uncomplicated hospital course. Postprocedural ECG and telemetry revealed atrial sensing with ventricular pacing. On the morning of 08/11/2020, the patient was ambulating without difficulty and was discharged home.   Discharge Exam: Blood pressure (!) 145/62, pulse 67, temperature 97.8 F (36.6 C), resp. rate 16, height 5\' 3"  (1.6 m), weight 72.7 kg, SpO2 96 %.  General appearance: alert Head: Normocephalic, without obvious abnormality, atraumatic Eyes: conjunctivae/corneas clear. PERRL, EOM's intact. Fundi benign. Ears: normal TM's and external ear canals both ears Nose: Nares normal. Septum midline. Mucosa normal. No drainage or sinus tenderness. Throat: lips, mucosa, and tongue normal; teeth and gums normal Neck: no adenopathy, no carotid bruit, no JVD, supple, symmetrical, trachea midline and thyroid not enlarged, symmetric, no tenderness/mass/nodules Back: symmetric, no curvature. ROM normal. No CVA tenderness. Resp: clear to auscultation bilaterally Chest wall: no tenderness Cardio: regular rate and rhythm, S1, S2 normal, no murmur, click, rub or gallop GI: soft, non-tender; bowel sounds normal; no masses,  no organomegaly Extremities: extremities normal, atraumatic, no cyanosis or edema Pulses: 2+ and symmetric Skin: Skin color, texture, turgor normal. No rashes or lesions Lymph nodes: Cervical, supraclavicular, and axillary  nodes normal. Neurologic: Grossly normal Incision/Wound: Well-healing Labs:   Lab Results  Component Value Date   WBC 5.2 08/16/2016   HGB 13.9 08/16/2016   HCT 41.9 08/16/2016   MCV 91.2 08/16/2016   PLT 122 (L) 08/16/2016    Recent Labs  Lab 08/11/20 0420  NA 138  K 3.9  CL 105  CO2 26  BUN 19  CREATININE 0.98  CALCIUM 8.8*  GLUCOSE 95      Radiology:  EKG: Atrial sensing with ventricular pacing  FOLLOW UP PLANS AND APPOINTMENTS  Allergies as of 08/11/2020      Reactions   Epinephrine Anaphylaxis, Other (See Comments)   Pass out   Sulfa Antibiotics Anaphylaxis, Shortness Of Breath   Mirabegron    Other reaction(s): Dizziness Constipation, headache, high-blood pressure, exhaustion, blurred vision, GI upset.      Medication List    TAKE these medications   cephALEXin 250 MG capsule Commonly known as: Keflex Take 2 capsules (500 mg total) by mouth 2 (two) times daily.   GUAIFENESIN ER PO Take 400 mg by mouth 2 (two) times daily as needed for to loosen phlegm.   Norvasc 2.5 MG tablet Generic drug: amLODipine Take 2.5 tablets by mouth at bedtime. Dr 14/05/2020   STOOL SOFTENER PO Take 1 tablet by mouth at bedtime.   Trimo-San 0.025 % Gel Generic drug: OXYQUINOLONE SULFATE VAGINAL Place 1 application vaginally 3 (three) times a week.       Follow-up Information    Jericka Kadar, MD Follow up in 1 week(s).   Specialty: Cardiology Contact information: [address] [facility] [place] [phone]               BRING ALL MEDICATIONS WITH YOU TO FOLLOW UP APPOINTMENTS  Time spent with patient to include physician time: 25 minutes Signed:  [phone] MD, PhD, [facility] 08/11/2020,  8:20 AM

## 2020-08-11 NOTE — Plan of Care (Signed)

## 2020-08-11 NOTE — Discharge Instructions (Signed)
May remove outer bandages left chest and right groin on 08/12/2020. Leave Steri-Strips on. May shower on 08/12/2020.

## 2020-08-17 DIAGNOSIS — Z23 Encounter for immunization: Secondary | ICD-10-CM | POA: Diagnosis not present

## 2020-08-17 DIAGNOSIS — Z950 Presence of cardiac pacemaker: Secondary | ICD-10-CM | POA: Diagnosis not present

## 2020-08-23 ENCOUNTER — Other Ambulatory Visit: Payer: Self-pay | Admitting: Family Medicine

## 2020-08-23 DIAGNOSIS — R059 Cough, unspecified: Secondary | ICD-10-CM

## 2020-08-30 ENCOUNTER — Emergency Department
Admission: EM | Admit: 2020-08-30 | Discharge: 2020-08-30 | Disposition: A | Discharge status: Home / Self Care | Payer: Medicare Other | Attending: Emergency Medicine | Admitting: Emergency Medicine

## 2020-08-30 ENCOUNTER — Emergency Department: Payer: Medicare Other

## 2020-08-30 ENCOUNTER — Other Ambulatory Visit: Payer: Self-pay

## 2020-08-30 DIAGNOSIS — W228XXA Striking against or struck by other objects, initial encounter: Secondary | ICD-10-CM | POA: Diagnosis not present

## 2020-08-30 DIAGNOSIS — R55 Syncope and collapse: Secondary | ICD-10-CM | POA: Diagnosis not present

## 2020-08-30 DIAGNOSIS — W19XXXA Unspecified fall, initial encounter: Secondary | ICD-10-CM

## 2020-08-30 DIAGNOSIS — S0101XA Laceration without foreign body of scalp, initial encounter: Secondary | ICD-10-CM | POA: Diagnosis not present

## 2020-08-30 DIAGNOSIS — Y92002 Bathroom of unspecified non-institutional (private) residence single-family (private) house as the place of occurrence of the external cause: Secondary | ICD-10-CM | POA: Diagnosis not present

## 2020-08-30 DIAGNOSIS — S52122A Displaced fracture of head of left radius, initial encounter for closed fracture: Secondary | ICD-10-CM | POA: Diagnosis not present

## 2020-08-30 DIAGNOSIS — S0181XA Laceration without foreign body of other part of head, initial encounter: Secondary | ICD-10-CM | POA: Diagnosis not present

## 2020-08-30 DIAGNOSIS — S6992XA Unspecified injury of left wrist, hand and finger(s), initial encounter: Secondary | ICD-10-CM | POA: Diagnosis present

## 2020-08-30 DIAGNOSIS — S52692A Other fracture of lower end of left ulna, initial encounter for closed fracture: Secondary | ICD-10-CM | POA: Diagnosis not present

## 2020-08-30 DIAGNOSIS — S52592A Other fractures of lower end of left radius, initial encounter for closed fracture: Secondary | ICD-10-CM | POA: Diagnosis not present

## 2020-08-30 DIAGNOSIS — Z950 Presence of cardiac pacemaker: Secondary | ICD-10-CM | POA: Diagnosis not present

## 2020-08-30 DIAGNOSIS — I10 Essential (primary) hypertension: Secondary | ICD-10-CM | POA: Diagnosis not present

## 2020-08-30 DIAGNOSIS — Z79899 Other long term (current) drug therapy: Secondary | ICD-10-CM | POA: Insufficient documentation

## 2020-08-30 DIAGNOSIS — S52352A Displaced comminuted fracture of shaft of radius, left arm, initial encounter for closed fracture: Secondary | ICD-10-CM | POA: Diagnosis not present

## 2020-08-30 DIAGNOSIS — M25551 Pain in right hip: Secondary | ICD-10-CM | POA: Diagnosis not present

## 2020-08-30 DIAGNOSIS — R42 Dizziness and giddiness: Secondary | ICD-10-CM | POA: Diagnosis not present

## 2020-08-30 DIAGNOSIS — S52502A Unspecified fracture of the lower end of left radius, initial encounter for closed fracture: Secondary | ICD-10-CM | POA: Diagnosis not present

## 2020-08-30 DIAGNOSIS — S62102A Fracture of unspecified carpal bone, left wrist, initial encounter for closed fracture: Secondary | ICD-10-CM

## 2020-08-30 DIAGNOSIS — S01511A Laceration without foreign body of lip, initial encounter: Secondary | ICD-10-CM | POA: Insufficient documentation

## 2020-08-30 DIAGNOSIS — R52 Pain, unspecified: Secondary | ICD-10-CM | POA: Diagnosis not present

## 2020-08-30 DIAGNOSIS — S01112A Laceration without foreign body of left eyelid and periocular area, initial encounter: Secondary | ICD-10-CM | POA: Diagnosis not present

## 2020-08-30 DIAGNOSIS — M25532 Pain in left wrist: Secondary | ICD-10-CM | POA: Diagnosis not present

## 2020-08-30 LAB — COMPREHENSIVE METABOLIC PANEL
ALT: 18 U/L (ref 0–44)
AST: 33 U/L (ref 15–41)
Albumin: 4.3 g/dL (ref 3.5–5.0)
Alkaline Phosphatase: 75 U/L (ref 38–126)
Anion gap: 9 (ref 5–15)
BUN: 18 mg/dL (ref 8–23)
CO2: 25 mmol/L (ref 22–32)
Calcium: 9.6 mg/dL (ref 8.9–10.3)
Chloride: 105 mmol/L (ref 98–111)
Creatinine, Ser: 0.94 mg/dL (ref 0.44–1.00)
GFR, Estimated: 58 mL/min — ABNORMAL LOW (ref >60)
Glucose, Bld: 117 mg/dL — ABNORMAL HIGH (ref 70–99)
Potassium: 4.2 mmol/L (ref 3.5–5.1)
Sodium: 139 mmol/L (ref 135–145)
Total Bilirubin: 1.3 mg/dL — ABNORMAL HIGH (ref 0.3–1.2)
Total Protein: 7.2 g/dL (ref 6.5–8.1)

## 2020-08-30 LAB — CBC
HCT: 48.2 % — ABNORMAL HIGH (ref 36.0–46.0)
Hemoglobin: 15.6 g/dL — ABNORMAL HIGH (ref 12.0–15.0)
MCH: 30.2 pg (ref 26.0–34.0)
MCHC: 32.4 g/dL (ref 30.0–36.0)
MCV: 93.4 fL (ref 80.0–100.0)
Platelets: 135 10*3/uL — ABNORMAL LOW (ref 150–400)
RBC: 5.16 MIL/uL — ABNORMAL HIGH (ref 3.87–5.11)
RDW: 13.6 % (ref 11.5–15.5)
WBC: 6 10*3/uL (ref 4.0–10.5)
nRBC: 0 % (ref 0.0–0.2)

## 2020-08-30 MED ORDER — TETANUS-DIPHTHERIA TOXOIDS TD 5-2 LFU IM INJ
0.5000 mL | INJECTION | Freq: Once | INTRAMUSCULAR | Status: DC
  Filled 2020-08-30: qty 0.5

## 2020-08-30 MED ORDER — LIDOCAINE HCL (PF) 1 % IJ SOLN: 5.0000 mL | Freq: Once | INTRAMUSCULAR | Status: DC

## 2020-08-30 MED ORDER — LIDOCAINE HCL (PF) 1 % IJ SOLN
INTRAMUSCULAR | Status: AC
  Filled 2020-08-30: qty 10

## 2020-08-30 NOTE — ED Provider Notes (Signed)
[facility] Emergency Department Provider Note  Time seen: 10:56 AM  I have reviewed the triage vital signs and the nursing notes.   HISTORY  Chief Complaint Loss of Consciousness and Fall   HPI Teresa Mays is a 86 y.o. female with a past medical history of hypertension, presents to the emergency department after a fall.  According to the patient she was using the bathroom this morning, when she stood up she began feeling lightheaded.  Patient states she going to get a Tylenol however next thing she remembers is awakening on the ground.  Patient believes she hit her head on the door frame.  Has suffered a laceration to her forehead as well as her upper lip.  Patient also has pain to her left wrist with what appears to be a deformity.  Mild pain to her right hip.  Patient is awake alert oriented, no distress lying in bed is able to give a good history and answer questions appropriately.   Past Medical History:  Diagnosis Date  . Bladder prolapse, female, acquired   . Hypertension     Patient Active Problem List   Diagnosis Date Noted  . Complete heart block (HCC) 08/10/2020  . Mastalgia 04/25/2016  . Bladder cystocele 11/11/2015  . BP (high blood pressure) 01/27/2014  . Encounter for checking and testing of cardiac pacemaker pulse generator (battery) 01/27/2014    Past Surgical History:  Procedure Laterality Date  . ABDOMINAL HYSTERECTOMY     40 years ago  . BREAST BIOPSY Left 1987   neg  . BREAST SURGERY  1990   Fibroadenom right   . COLONOSCOPY  2005  . PACEMAKER INSERTION  [id number]  . PACEMAKER LEADLESS INSERTION N/A 08/10/2020   Procedure: PACEMAKER LEADLESS INSERTION;  Surgeon: Marcina Millard, MD;  Location: [facility] INVASIVE CV LAB;  Service: Cardiovascular;  Laterality: N/A;  . PPM GENERATOR REMOVAL N/A 08/10/2020   Procedure: PPM GENERATOR REMOVAL;  Surgeon: Marcina Millard, MD;  Location: [facility] INVASIVE CV LAB;  Service:  Cardiovascular;  Laterality: N/A;    Prior to Admission medications   Medication Sig Start Date End Date Taking? Authorizing Provider  cephALEXin (KEFLEX) 250 MG capsule Take 2 capsules (500 mg total) by mouth 2 (two) times daily. 08/11/20   Paraschos, Alexander, MD  Docusate Calcium (STOOL SOFTENER PO) Take 1 tablet by mouth at bedtime.    [provider]  GUAIFENESIN ER PO Take 400 mg by mouth 2 (two) times daily as needed for to loosen phlegm.     [provider]  NORVASC 2.5 MG tablet Take 2.5 tablets by mouth at bedtime. Dr Darrold Junker 10/19/15   [provider]  OXYQUINOLONE SULFATE VAGINAL (TRIMO-SAN) 0.025 % GEL Place 1 application vaginally 3 (three) times a week.  11/18/19   [provider]    Allergies  Allergen Reactions  . Epinephrine Anaphylaxis and Other (See Comments)    Pass out  . Sulfa Antibiotics Anaphylaxis and Shortness Of Breath  . Mirabegron     Other reaction(s): Dizziness Constipation, headache, high-blood pressure, exhaustion, blurred vision, GI upset.    Family History  Problem Relation Age of Onset  . Breast cancer Neg Hx     Social History Social History   Tobacco Use  . Smoking status: Never Smoker  . Smokeless tobacco: Never Used  Vaping Use  . Vaping Use: Never used  Substance Use Topics  . Alcohol use: No    Alcohol/week: 0.0 standard drinks  . Drug  use: No    Review of Systems Constitutional: Positive for LOC Cardiovascular: Negative for chest pain. Respiratory: Negative for shortness of breath. Gastrointestinal: Negative for abdominal pain Musculoskeletal: Moderate left wrist pain.  Mild right hip pain Skin: Laceration to forehead as well as upper lip Neurological: Negative for headache All other ROS negative  ____________________________________________   PHYSICAL EXAM:  VITAL SIGNS: ED Triage Vitals [08/30/20 1041]  Enc Vitals Group     BP      Pulse      Resp      Temp      Temp src       SpO2      Weight 147 lb (66.7 kg)     Height 5\' 3"  (1.6 m)     Head Circumference      Peak Flow      Pain Score 7     Pain Loc      Pain Edu?      Excl. in GC?     Constitutional: Alert and oriented. Well appearing and in no distress. Eyes: Normal exam ENT      Head: Patient has a laceration through her upper lip up to the vermilion border but does not appear to cross the vermilion border.  Hemostatic.  Patient has approximate 2 cm laceration to her forehead, mostly hemostatic.      Nose: No congestion/rhinnorhea.      Mouth/Throat: Mucous membranes are moist.  No obvious intraoral injuries. Cardiovascular: Normal rate, regular rhythm. Respiratory: Normal respiratory effort without tachypnea nor retractions. Breath sounds are clear  Gastrointestinal: Soft and nontender. No distention Musculoskeletal: Apparent deformity to the distal left forearm/wrist.  Neuro vastly intact distally.  Tender to the touch.  Mild tenderness to right hip however good range of motion with minimal discomfort.  Patient thinks this is "bruised." Neurologic:  Normal speech and language. No gross focal neurologic deficits  Skin:  Skin is warm, dry and intact.  Psychiatric: Mood and affect are normal.   ____________________________________________    EKG  EKG viewed and interpreted by myself shows a ventricular paced rhythm at 104 bpm with a widened QRS, no concerning ST changes.  ____________________________________________    RADIOLOGY  CT scans are negative for acute abnormality. X-ray shows distal radius/ulna deformity.  ____________________________________________   INITIAL IMPRESSION / ASSESSMENT AND PLAN / ED COURSE  Pertinent labs & imaging results that were available during my care of the patient were reviewed by me and considered in my medical decision making (see chart for details).   Patient presents emergency department after a fall.  Symptoms are very suggestive of orthostatic  hypotension.  We will check labs, urinalysis and IV hydrate.  We will obtain a CT scan of the head as well as left wrist and hip x-rays.  Patient likely has a fracture to her left wrist but is neurovascularly intact.  Lacerations will require suture repair.  X-ray shows distal radius/ulna deformity.  Spoke to Dr. who will see the patient in the office tomorrow.  We are able to splint in the emergency department with mild reduction.  Lab work is largely nonrevealing.  I have repaired the lacerations and splinted the left forearm.  Remains neurovascular intact status post splint.  LACERATION REPAIR Performed by: Rosita Kea Authorized by: Minna Antis Consent: Verbal consent obtained. Risks and benefits: risks, benefits and alternatives were discussed Consent given by: patient Patient identity confirmed: provided demographic data Prepped and Draped in normal sterile fashion Wound explored  Laceration Location: Left eyebrow  Laceration Length: 2 cm  No Foreign Bodies seen or palpated  Anesthesia: local infiltration  Local anesthetic: lidocaine 1% without epinephrine  Anesthetic total: 2 ml  Irrigation method: syringe Amount of cleaning: standard  Skin closure: 5-0 rapid Vicryl  Number of sutures: 3  Technique: Simple interrupted  Patient tolerance: Patient tolerated the procedure well with no immediate complications.  LACERATION REPAIR #2 Performed by: Minna Antis Authorized by: Minna Antis Consent: Verbal consent obtained. Risks and benefits: risks, benefits and alternatives were discussed Consent given by: patient Patient identity confirmed: provided demographic data Prepped and Draped in normal sterile fashion Wound explored  Laceration Location: Upper lip  Laceration Length: 0.5 cm  No Foreign Bodies seen or palpated  Anesthesia: local infiltration  Local anesthetic: lidocaine 1% without epinephrine  Anesthetic total: 2  ml  Irrigation method: syringe Amount of cleaning: standard  Skin closure: 5-0 rapid Vicryl  Number of sutures: 3  Technique: Simple interrupted  Patient tolerance: Patient tolerated the procedure well with no immediate complications.  SPLINT APPLICATION Date/Time: 1:46 PM Authorized by: Minna Antis Consent: Verbal consent obtained. Risks and benefits: risks, benefits and alternatives were discussed Consent given by: patient Splint applied by: Myself Location details: Left forearm Splint type: Sugar tong Supplies used: Ortho-Glass Post-procedure: The splinted body part was neurovascularly unchanged following the procedure. Patient tolerance: Patient tolerated the procedure well with no immediate complications.     Teresa Mays was evaluated in Emergency Department on 08/30/2020 for the symptoms described in the history of present illness. She was evaluated in the context of the global COVID-19 pandemic, which necessitated consideration that the patient might be at risk for infection with the SARS-CoV-2 virus that causes COVID-19. Institutional protocols and algorithms that pertain to the evaluation of patients at risk for COVID-19 are in a state of rapid change based on information released by regulatory bodies including the CDC and federal and state organizations. These policies and algorithms were followed during the patient's care in the ED.  ____________________________________________   FINAL CLINICAL IMPRESSION(S) / ED DIAGNOSES  Fall Lacerations Left wrist fracture   Minna Antis, MD 08/30/20 1347

## 2020-08-30 NOTE — ED Triage Notes (Addendum)
Pt here via [facility] from home.   Pt was using the bathroom today when she started feeling weak and sweaty. Pt ambulated to bedroom, passed out and fell on route. Pt has obvious deformity to L wrist w/brace placed by EMS, laceration to lip, skin tear present to R wrist , laceration to forehead, abrasion to L knee. Pt a/ox4.   Pt has ventricular pace maker, recently replaced 08/10/20.  Ems VSS. Cbg 128.

## 2020-08-30 NOTE — ED Notes (Signed)
Patient transported to CT 

## 2020-08-30 NOTE — Discharge Instructions (Signed)
You have been seen in the emergency department following a fall.  Your work-up shows a left wrist fracture.  Please follow-up with orthopedics tomorrow in the office.  Please call today to confirm your time and let them know you are seen in the emergency department.  You have had 2 lacerations repaired today with absorbable sutures.  Please keep dry for the next 24 hours.  After which time you may get wet but allowed to air dry do not wipe.  Sutures do not need to be removed and will absorb/fall out on their own in 5 to 7 days.  Return to the emergency department for any signs of infection such as fever increased pain or pus.

## 2020-08-31 ENCOUNTER — Other Ambulatory Visit
Admission: RE | Admit: 2020-08-31 | Discharge: 2020-08-31 | Disposition: A | Discharge status: Home / Self Care | Payer: Medicare Other | Source: Ambulatory Visit | Attending: Orthopedic Surgery | Admitting: Orthopedic Surgery

## 2020-08-31 ENCOUNTER — Other Ambulatory Visit: Payer: Self-pay | Admitting: Orthopedic Surgery

## 2020-08-31 DIAGNOSIS — Z20822 Contact with and (suspected) exposure to covid-19: Secondary | ICD-10-CM | POA: Diagnosis not present

## 2020-08-31 DIAGNOSIS — S52532A Colles' fracture of left radius, initial encounter for closed fracture: Secondary | ICD-10-CM | POA: Diagnosis not present

## 2020-08-31 DIAGNOSIS — Z01812 Encounter for preprocedural laboratory examination: Secondary | ICD-10-CM | POA: Insufficient documentation

## 2020-08-31 DIAGNOSIS — M25562 Pain in left knee: Secondary | ICD-10-CM | POA: Diagnosis not present

## 2020-08-31 LAB — SARS CORONAVIRUS 2 (TAT 6-24 HRS): SARS Coronavirus 2: NEGATIVE

## 2020-09-01 ENCOUNTER — Other Ambulatory Visit: Payer: Self-pay

## 2020-09-01 ENCOUNTER — Ambulatory Visit
Admission: RE | Admit: 2020-09-01 | Discharge: 2020-09-01 | Disposition: A | Discharge status: Home / Self Care | Payer: Medicare Other | Attending: Orthopedic Surgery | Admitting: Orthopedic Surgery

## 2020-09-01 ENCOUNTER — Ambulatory Visit: Payer: Medicare Other

## 2020-09-01 ENCOUNTER — Ambulatory Visit: Payer: Medicare Other | Admitting: Registered Nurse

## 2020-09-01 ENCOUNTER — Other Ambulatory Visit: Payer: Medicare Other

## 2020-09-01 ENCOUNTER — Encounter
Admission: RE | Disposition: A | Discharge status: Home / Self Care | Payer: Self-pay | Source: Home / Self Care | Attending: Orthopedic Surgery

## 2020-09-01 ENCOUNTER — Encounter: Payer: Self-pay | Admitting: Orthopedic Surgery

## 2020-09-01 DIAGNOSIS — W19XXXA Unspecified fall, initial encounter: Secondary | ICD-10-CM | POA: Insufficient documentation

## 2020-09-01 DIAGNOSIS — Z8249 Family history of ischemic heart disease and other diseases of the circulatory system: Secondary | ICD-10-CM | POA: Diagnosis not present

## 2020-09-01 DIAGNOSIS — I10 Essential (primary) hypertension: Secondary | ICD-10-CM | POA: Diagnosis not present

## 2020-09-01 DIAGNOSIS — Z888 Allergy status to other drugs, medicaments and biological substances status: Secondary | ICD-10-CM | POA: Insufficient documentation

## 2020-09-01 DIAGNOSIS — Z882 Allergy status to sulfonamides status: Secondary | ICD-10-CM | POA: Insufficient documentation

## 2020-09-01 DIAGNOSIS — S52532A Colles' fracture of left radius, initial encounter for closed fracture: Secondary | ICD-10-CM | POA: Diagnosis not present

## 2020-09-01 DIAGNOSIS — Z79899 Other long term (current) drug therapy: Secondary | ICD-10-CM | POA: Diagnosis not present

## 2020-09-01 DIAGNOSIS — Z8781 Personal history of (healed) traumatic fracture: Secondary | ICD-10-CM

## 2020-09-01 DIAGNOSIS — S52592A Other fractures of lower end of left radius, initial encounter for closed fracture: Secondary | ICD-10-CM | POA: Diagnosis not present

## 2020-09-01 HISTORY — PX: ORIF WRIST FRACTURE: SHX2133

## 2020-09-01 SURGERY — OPEN REDUCTION INTERNAL FIXATION (ORIF) WRIST FRACTURE
Anesthesia: General | Site: Wrist | Laterality: Left

## 2020-09-01 MED ORDER — FENTANYL CITRATE (PF) 100 MCG/2ML IJ SOLN
INTRAMUSCULAR | Status: AC
  Filled 2020-09-01: qty 2

## 2020-09-01 MED ORDER — KETAMINE HCL 50 MG/ML IJ SOLN
INTRAMUSCULAR | Status: DC | PRN
  Administered 2020-09-01: 10 mg via INTRAVENOUS

## 2020-09-01 MED ORDER — LACTATED RINGERS IV SOLN: INTRAVENOUS | Status: DC

## 2020-09-01 MED ORDER — METOCLOPRAMIDE HCL 5 MG/ML IJ SOLN: 5.0000 mg | Freq: Three times a day (TID) | INTRAMUSCULAR | Status: DC | PRN

## 2020-09-01 MED ORDER — PHENYLEPHRINE HCL (PRESSORS) 10 MG/ML IV SOLN
INTRAVENOUS | Status: AC
  Filled 2020-09-01: qty 1

## 2020-09-01 MED ORDER — METOCLOPRAMIDE HCL 10 MG PO TABS
5.0000 mg | ORAL_TABLET | Freq: Three times a day (TID) | ORAL | Status: DC | PRN
Start: 2020-09-01 — End: 2020-09-01

## 2020-09-01 MED ORDER — PROPOFOL 10 MG/ML IV BOLUS
INTRAVENOUS | Status: AC
  Filled 2020-09-01: qty 20

## 2020-09-01 MED ORDER — ACETAMINOPHEN 10 MG/ML IV SOLN: 1000.0000 mg | Freq: Once | INTRAVENOUS | Status: AC

## 2020-09-01 MED ORDER — PREGABALIN 25 MG PO CAPS
25.0000 mg | ORAL_CAPSULE | Freq: Once | ORAL | Status: AC
  Administered 2020-09-01: 25 mg via ORAL
  Filled 2020-09-01: qty 1

## 2020-09-01 MED ORDER — ONDANSETRON HCL 4 MG/2ML IJ SOLN: 4.0000 mg | Freq: Four times a day (QID) | INTRAMUSCULAR | Status: DC | PRN

## 2020-09-01 MED ORDER — FENTANYL CITRATE (PF) 100 MCG/2ML IJ SOLN
INTRAMUSCULAR | Status: DC | PRN
  Administered 2020-09-01 (×3): 25 ug via INTRAVENOUS

## 2020-09-01 MED ORDER — PROPOFOL 10 MG/ML IV BOLUS
INTRAVENOUS | Status: DC | PRN
  Administered 2020-09-01: 70 mg via INTRAVENOUS

## 2020-09-01 MED ORDER — ONDANSETRON HCL 4 MG/2ML IJ SOLN
INTRAMUSCULAR | Status: AC
  Filled 2020-09-01: qty 2

## 2020-09-01 MED ORDER — ONDANSETRON HCL 4 MG/2ML IJ SOLN
INTRAMUSCULAR | Status: DC | PRN
  Administered 2020-09-01: 4 mg via INTRAVENOUS

## 2020-09-01 MED ORDER — NEOMYCIN-POLYMYXIN B GU 40-200000 IR SOLN
Status: DC | PRN
  Administered 2020-09-01: 2 mL

## 2020-09-01 MED ORDER — SODIUM CHLORIDE 0.9 % IV SOLN: INTRAVENOUS | Status: DC

## 2020-09-01 MED ORDER — ACETAMINOPHEN 10 MG/ML IV SOLN
INTRAVENOUS | Status: AC
  Administered 2020-09-01: 1000 mg via INTRAVENOUS
  Filled 2020-09-01: qty 100

## 2020-09-01 MED ORDER — ORAL CARE MOUTH RINSE: 15.0000 mL | Freq: Once | OROMUCOSAL | Status: DC

## 2020-09-01 MED ORDER — CEFAZOLIN SODIUM-DEXTROSE 2-4 GM/100ML-% IV SOLN
2.0000 g | INTRAVENOUS | Status: AC
  Administered 2020-09-01: 2 g via INTRAVENOUS

## 2020-09-01 MED ORDER — DEXAMETHASONE SODIUM PHOSPHATE 10 MG/ML IJ SOLN
INTRAMUSCULAR | Status: AC
  Filled 2020-09-01: qty 1

## 2020-09-01 MED ORDER — CEFAZOLIN SODIUM-DEXTROSE 2-4 GM/100ML-% IV SOLN
INTRAVENOUS | Status: AC
  Filled 2020-09-01: qty 100

## 2020-09-01 MED ORDER — ONDANSETRON HCL 4 MG PO TABS: 4.0000 mg | ORAL_TABLET | Freq: Four times a day (QID) | ORAL | Status: DC | PRN

## 2020-09-01 MED ORDER — FENTANYL CITRATE (PF) 100 MCG/2ML IJ SOLN
INTRAMUSCULAR | Status: AC
  Administered 2020-09-01: 25 ug via INTRAVENOUS
  Filled 2020-09-01: qty 2

## 2020-09-01 MED ORDER — CHLORHEXIDINE GLUCONATE 0.12 % MT SOLN
OROMUCOSAL | Status: AC
  Filled 2020-09-01: qty 15

## 2020-09-01 MED ORDER — FENTANYL CITRATE (PF) 100 MCG/2ML IJ SOLN
25.0000 ug | INTRAMUSCULAR | Status: DC | PRN
Start: 2020-09-01 — End: 2020-09-01
  Administered 2020-09-01 (×4): 25 ug via INTRAVENOUS

## 2020-09-01 MED ORDER — LIDOCAINE HCL (CARDIAC) PF 100 MG/5ML IV SOSY
PREFILLED_SYRINGE | INTRAVENOUS | Status: DC | PRN
  Administered 2020-09-01: 40 mg via INTRAVENOUS
  Administered 2020-09-01: 60 mg via INTRAVENOUS

## 2020-09-01 MED ORDER — DEXAMETHASONE SODIUM PHOSPHATE 10 MG/ML IJ SOLN
INTRAMUSCULAR | Status: DC | PRN
  Administered 2020-09-01: 6 mg via INTRAVENOUS

## 2020-09-01 MED ORDER — ONDANSETRON HCL 4 MG/2ML IJ SOLN: 4.0000 mg | Freq: Once | INTRAMUSCULAR | Status: DC | PRN

## 2020-09-01 MED ORDER — CHLORHEXIDINE GLUCONATE 0.12 % MT SOLN: 15.0000 mL | Freq: Once | OROMUCOSAL | Status: DC

## 2020-09-01 MED ORDER — NEOMYCIN-POLYMYXIN B GU 40-200000 IR SOLN
Status: AC
  Filled 2020-09-01: qty 2

## 2020-09-01 SURGICAL SUPPLY — 42 items
BIT DRILL 2 FAST STEP (BIT) ×2 IMPLANT
BIT DRILL 2.5X4 QC (BIT) ×2 IMPLANT
BNDG ELASTIC 4X5.8 VLCR NS LF (GAUZE/BANDAGES/DRESSINGS) ×2 IMPLANT
BNDG ELASTIC 4X5.8 VLCR STR LF (GAUZE/BANDAGES/DRESSINGS) ×2 IMPLANT
CHLORAPREP W/TINT 26 (MISCELLANEOUS) ×2 IMPLANT
COVER WAND RF STERILE (DRAPES) ×2 IMPLANT
CUFF TOURN SGL QUICK 18X4 (TOURNIQUET CUFF) ×2 IMPLANT
DRAPE FLUOR MINI C-ARM 54X84 (DRAPES) ×2 IMPLANT
ELECT CAUTERY BLADE 6.4 (BLADE) ×2 IMPLANT
ELECT REM PT RETURN 9FT ADLT (ELECTROSURGICAL) ×2
ELECTRODE REM PT RTRN 9FT ADLT (ELECTROSURGICAL) ×1 IMPLANT
GAUZE SPONGE 4X4 12PLY STRL (GAUZE/BANDAGES/DRESSINGS) ×2 IMPLANT
GAUZE XEROFORM 1X8 LF (GAUZE/BANDAGES/DRESSINGS) ×2 IMPLANT
GLOVE SURG SYN 9.0  PF PI (GLOVE) ×1
GLOVE SURG SYN 9.0 PF PI (GLOVE) ×1 IMPLANT
GOWN SRG 2XL LVL 4 RGLN SLV (GOWNS) ×1 IMPLANT
GOWN STRL NON-REIN 2XL LVL4 (GOWNS) ×1
GOWN STRL REUS W/ TWL LRG LVL3 (GOWN DISPOSABLE) ×1 IMPLANT
GOWN STRL REUS W/TWL LRG LVL3 (GOWN DISPOSABLE) ×1
K-WIRE 1.6 (WIRE) ×1
K-WIRE FX5X1.6XNS BN SS (WIRE) ×1
KIT TURNOVER KIT A (KITS) ×2 IMPLANT
KWIRE FX5X1.6XNS BN SS (WIRE) ×1 IMPLANT
MANIFOLD NEPTUNE II (INSTRUMENTS) ×2 IMPLANT
NEEDLE FILTER BLUNT 18X 1/2SAF (NEEDLE) ×1
NEEDLE FILTER BLUNT 18X1 1/2 (NEEDLE) ×1 IMPLANT
NS IRRIG 500ML POUR BTL (IV SOLUTION) ×2 IMPLANT
PACK EXTREMITY ARMC (MISCELLANEOUS) ×2 IMPLANT
PAD CAST CTTN 4X4 STRL (SOFTGOODS) ×1 IMPLANT
PADDING CAST COTTON 4X4 STRL (SOFTGOODS) ×1
PEG SUBCHONDRAL SMOOTH 2.0X14 (Peg) ×2 IMPLANT
PEG SUBCHONDRAL SMOOTH 2.0X16 (Peg) ×4 IMPLANT
PEG SUBCHONDRAL SMOOTH 2.0X18 (Peg) ×6 IMPLANT
PLATE SHORT 21.6X48.9 NRRW LT (Plate) ×2 IMPLANT
SCALPEL PROTECTED #15 DISP (BLADE) ×4 IMPLANT
SCREW CORT 3.5X10 LNG (Screw) ×6 IMPLANT
SPLINT CAST 1 STEP 3X12 (MISCELLANEOUS) ×2 IMPLANT
SUT ETHILON 4-0 (SUTURE) ×1
SUT ETHILON 4-0 FS2 18XMFL BLK (SUTURE) ×1
SUT VICRYL 3-0 27IN (SUTURE) ×2 IMPLANT
SUTURE ETHLN 4-0 FS2 18XMF BLK (SUTURE) ×1 IMPLANT
SYR 3ML LL SCALE MARK (SYRINGE) ×2 IMPLANT

## 2020-09-01 NOTE — H&P (Signed)
Chief Complaint  Patient presents with   Hospital Follow Up  Lt Radius fx, fall yesterday    History of the Present Illness: Teresa Mays is a 86 y.o. female here today.   The patient presents for evaluation of left distal radius and ulna fracture she obtained on 08/30/2020. She comes in also complaining of knee pain. The left knee was x-rayed. She comes in today to discuss potential operative versus nonoperative treatment of the left wrist fracture.  The patient states she thinks all her weight went onto her left hand. The patient is right-hand dominant.  The patient states she is allergic to EPINEPHRINE.  The patient is not on blood thinners. She takes amlodipine. The patient uses a pessery. She has a history of a broken ankle with fixation.   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   I have reviewed past medical, surgical, social and family history, and allergies as documented in the EMR.  Past Medical History: Past Medical History:  Diagnosis Date   Female bladder prolapse   Hypertension   Past Surgical History: Past Surgical History:  Procedure Laterality Date   BREAST SURGERY   HYSTERECTOMY   INSERT / REPLACE / REMOVE PACEMAKER   Past Family History: Family History  Problem Relation Age of Onset   Myocardial Infarction (Heart attack) Mother   High blood pressure (Hypertension) Mother   High blood pressure (Hypertension) Father   High blood pressure (Hypertension) Son  on blood pressure medicine   Medications: Current Outpatient Medications Ordered in Epic  Medication Sig Dispense Refill   amLODIPine (NORVASC) 2.5 MG tablet TAKE (1) TABLET BY MOUTH EVERY DAY 30 tablet 3   oxyquinoline-sod.lauryl sulfat (TRIMO-SAN JELLY) 0.025-0.01 % Gel Place 1 Applicatorful vaginally 3 (three) times a week 1 Tube 3   No current Epic-ordered facility-administered medications on file.   Allergies: Allergies  Allergen Reactions    Epinephrine Unknown   Mirabegron Dizziness  Constipation, headache, high-blood pressure, exhaustion, blurred vision, GI upset.   Sulfa (Sulfonamide Antibiotics) Unknown    Body mass index is 26.15 kg/m.  Review of Systems: A comprehensive 14 point ROS was performed, reviewed, and the pertinent orthopaedic findings are documented in the HPI.  Vitals:  08/31/20 1244  BP: 134/82    General Physical Examination:   General/Constitutional: No apparent distress: well-nourished and well developed. Eyes: Pupils equal, round with synchronous movement. Lungs: Clear to auscultation HEENT: Normal. Teeth in good condition. Vascular: No edema, swelling or tenderness, except as noted in detailed exam. Cardiac: Heart rate and rhythm is regular. Integumentary: No impressive skin lesions present, except as noted in detailed exam. Neuro/Psych: Normal mood and affect, oriented to person, place and time.  Musculoskeletal Examination:  On exam, sensation intact to the left distal radius. Neurovascularly intact to the left wrist. The patient has a small scrape on the left knee.   Radiographs:  AP, lateral, standing and sunrise x-rays of the left knee were ordered and personally reviewed today. These show diffuse osteopenia. No apparent fracture. There is medial joint space narrowing. Mild effusion on the lateral view with minimal osteophytes posteriorly and anteriorly. Moderate patellofemoral degenerative change. Fracture had significant angulation, and also more of a distal ulnar shaft than just styloid.  X-ray Impression Fairly advanced medial compartment osteoarthritis, moderate patellofemoral arthritis. No evidence of acute fracture.   Assessment: ICD-10-CM  1. Closed Colles' fracture of left radius, initial encounter S52.532A  2. Acute pain of left knee M25.562  Plan:  The patient has clinical findings of displaced left distal radius fracture, extra-articular.  We discussed the  patient's x-ray findings. I explained she has arthritis in her left knee, but no fracture. For her left wrist fracture, we will plan for ORIF, possibly under regional block, as she is very active. Hopefully, she can regain function. We discussed the surgery and postoperative course. She will go for a COVID-19 test now.  Surgical Risks:  The nature of the condition and the proposed procedure has been reviewed in detail with the patient. Surgical versus non-surgical options and prognosis for recovery have been reviewed and the inherent risks and benefits of each have been discussed including the risks of infection, bleeding, injury to nerves/blood vessels/tendons, incomplete relief of symptoms, persisting pain and/or stiffness, loss of function, complex regional pain syndrome, failure of the procedure, as appropriate.  Teeth: Normal  Attestation: I, Dawn Royse, am documenting for [facility] MD utilizing Nuance DAX.     Electronically signed by Marlena Clipper, MD at 08/31/2020 9:09 PM EST    Reviewed  H+P. No changes noted.

## 2020-09-01 NOTE — Transfer of Care (Signed)
Immediate Anesthesia Transfer of Care Note  Patient: Teresa Mays  Procedure(s) Performed: OPEN REDUCTION INTERNAL FIXATION (ORIF) WRIST FRACTURE (Left Wrist)  Patient Location: PACU  Anesthesia Type:General  Level of Consciousness: drowsy  Airway & Oxygen Therapy: Patient Spontanous Breathing and Patient connected to face mask oxygen  Post-op Assessment: Report given to RN and Post -op Vital signs reviewed and stable  Post vital signs: Reviewed and stable  Last Vitals:  Vitals Value Taken Time  BP 152/79 09/01/20 1400  Temp 36.2 C 09/01/20 1400  Pulse 79 09/01/20 1401  Resp 15 09/01/20 1401  SpO2 100 % 09/01/20 1401  Vitals shown include unvalidated device data.  Last Pain:  Vitals:   09/01/20 1114  TempSrc: Temporal  PainSc: 0-No pain         Complications: No complications documented.

## 2020-09-01 NOTE — Anesthesia Preprocedure Evaluation (Signed)
Anesthesia Evaluation  Patient identified by MRN, date of birth, ID band Patient awake    Reviewed: Allergy & Precautions, H&P , NPO status , Patient's Chart, lab work & pertinent test results, reviewed documented beta blocker date and time   History of Anesthesia Complications Negative for: history of anesthetic complications  Airway Mallampati: II  TM Distance: >3 FB Neck ROM: full    Dental  (+) Caps, Dental Advidsory Given, Teeth Intact   Pulmonary neg pulmonary ROS,    Pulmonary exam normal breath sounds clear to auscultation       Cardiovascular Exercise Tolerance: Good hypertension, (-) angina(-) Past MI and (-) Cardiac Stents Normal cardiovascular exam+ dysrhythmias (complete heart block) + pacemaker + Valvular Problems/Murmurs  Rhythm:regular Rate:Normal     Neuro/Psych negative neurological ROS  negative psych ROS   GI/Hepatic Neg liver ROS, GERD  ,  Endo/Other  negative endocrine ROS  Renal/GU negative Renal ROS  negative genitourinary   Musculoskeletal   Abdominal   Peds  Hematology negative hematology ROS (+)   Anesthesia Other Findings Past Medical History: No date: Bladder prolapse, female, acquired No date: Hypertension   Reproductive/Obstetrics negative OB ROS                             Anesthesia Physical Anesthesia Plan  ASA: II  Anesthesia Plan: General   Post-op Pain Management:    Induction: Intravenous  PONV Risk Score and Plan: 3 and Ondansetron, Dexamethasone and Treatment may vary due to age or medical condition  Airway Management Planned: LMA  Additional Equipment:   Intra-op Plan:   Post-operative Plan: Extubation in OR  Informed Consent: I have reviewed the patients History and Physical, chart, labs and discussed the procedure including the risks, benefits and alternatives for the proposed anesthesia with the patient or authorized  representative who has indicated his/her understanding and acceptance.     Dental Advisory Given  Plan Discussed with: Anesthesiologist, CRNA and Surgeon  Anesthesia Plan Comments:         Anesthesia Quick Evaluation

## 2020-09-01 NOTE — Op Note (Signed)
09/01/2020  2:11 PM  PATIENT:  Teresa Mays  86 y.o. female  PRE-OPERATIVE DIAGNOSIS:  Closed Colles' fracture of left radius, initial encounter S52.532A extra-articular  POST-OPERATIVE DIAGNOSIS:  Closed Colles' fracture of left radius, initial encounter S52.532A  PROCEDURE:  Procedure(s): OPEN REDUCTION INTERNAL FIXATION (ORIF) WRIST FRACTURE (Left)  SURGEON: Leitha Schuller, MD  ASSISTANTS: None  ANESTHESIA:   general  EBL:  Total I/O In: 400 [I.V.:300; IV Piggyback:100] Out: 5 [Blood:5]  BLOOD ADMINISTERED:none  DRAINS: none   LOCAL MEDICATIONS USED:  NONE  SPECIMEN:  No Specimen  DISPOSITION OF SPECIMEN:  N/A  COUNTS:  YES  TOURNIQUET:   Total Tourniquet Time Documented: Upper Arm (Left) - 16 minutes Total: Upper Arm (Left) - 16 minutes   IMPLANTS: [employer] hand innovations short narrow left DVR plate with multiple smooth pegs and cortical screws  DICTATION: .Dragon Dictation patient was brought to the operating room and after adequate anesthesia was obtained the left arm was prepped and draped in usual sterile fashion.  After patient identification and timeout procedure were completed fingertrap traction applied off the end of the table with a pounds of traction along with inflation of the tourniquet.  The FCR tendon was the landmark for the volar incision the tendon sheath incised the tendon retracted radially.  Deep fascia incised in the pronator was elevated off the distal shaft and distal fragment.  With traction applied length was restored with a Therapist, nutritional near anatomic alignment obtained.  Distal first technique was utilized with smooth pegs placed after first pinning and checking position on C arm.  The 6 smooth pegs were filled distally without any involvement of the joint followed by 3 cortical screws into the shaft and this gave anatomic alignment.  The wrist was examined under fluoroscopy with traction removed and was stable.  The wound was  irrigated and the tourniquet let down.  The wound was then closed with 3-0 Vicryl subcutaneously and 4-0 nylon for the skin in a simple interrupted manner.  Xeroform 4 x 4 web roll and volar splint applied followed by an Ace wrap.  PLAN OF CARE: Discharge to home after PACU  PATIENT DISPOSITION:  PACU - hemodynamically stable.

## 2020-09-01 NOTE — Discharge Instructions (Addendum)
AMBULATORY SURGERY  DISCHARGE INSTRUCTIONS   1) The drugs that you were given will stay in your system until tomorrow so for the next 24 hours you should not:  A) Drive an automobile B) Make any legal decisions C) Drink any alcoholic beverage   2) You may resume regular meals tomorrow.  Today it is better to start with liquids and gradually work up to solid foods.  You may eat anything you prefer, but it is better to start with liquids, then soup and crackers, and gradually work up to solid foods.   3) Please notify your doctor immediately if you have any unusual bleeding, trouble breathing, redness and pain at the surgery site, drainage, fever, or pain not relieved by medication.    4) Additional Instructions:        Please contact your physician with any problems or Same Day Surgery at [phone], Monday through Friday 6 am to 4 pm, or [place] at [facility] number at [phone].   Keep arm elevated is much as you can. Work on making a fist and moving your fingers all you can this weekend. Call office if you are having problems. Ice to the back of the wrist today and tomorrow may help with pain and swelling.

## 2020-09-01 NOTE — Anesthesia Procedure Notes (Signed)
Procedure Name: LMA Insertion Date/Time: 09/01/2020 1:11 PM Performed by: Lynden Oxford, CRNA Pre-anesthesia Checklist: Patient identified, Emergency Drugs available, Suction available and Patient being monitored Patient Re-evaluated:Patient Re-evaluated prior to induction Oxygen Delivery Method: Circle system utilized Preoxygenation: Pre-oxygenation with 100% oxygen Induction Type: IV induction Ventilation: Mask ventilation without difficulty LMA: LMA flexible inserted LMA Size: 3.5 Tube type: Oral Number of attempts: 1 Airway Equipment and Method: Oral airway Placement Confirmation: positive ETCO2 and breath sounds checked- equal and bilateral Tube secured with: Tape Dental Injury: Teeth and Oropharynx as per pre-operative assessment

## 2020-09-02 ENCOUNTER — Encounter: Payer: Self-pay | Admitting: Orthopedic Surgery

## 2020-09-05 DIAGNOSIS — S52532D Colles' fracture of left radius, subsequent encounter for closed fracture with routine healing: Secondary | ICD-10-CM | POA: Diagnosis not present

## 2020-09-06 NOTE — Anesthesia Postprocedure Evaluation (Signed)
Anesthesia Post Note  Patient: Teresa Mays  Procedure(s) Performed: OPEN REDUCTION INTERNAL FIXATION (ORIF) WRIST FRACTURE (Left Wrist)  Patient location during evaluation: PACU Anesthesia Type: General Level of consciousness: awake and alert Pain management: pain level controlled Vital Signs Assessment: post-procedure vital signs reviewed and stable Respiratory status: spontaneous breathing, nonlabored ventilation, respiratory function stable and patient connected to nasal cannula oxygen Cardiovascular status: blood pressure returned to baseline and stable Postop Assessment: no apparent nausea or vomiting Anesthetic complications: no   No complications documented.   Last Vitals:  Vitals:   09/01/20 1638 09/01/20 1703  BP: (!) 152/67 (!) 146/72  Pulse: 72 75  Resp: 16 16  Temp: 36.9 C   SpO2: 97% 95%    Last Pain:  Vitals:   09/01/20 1703  TempSrc:   PainSc: 2                  Lenard Simmer

## 2020-09-28 DIAGNOSIS — N814 Uterovaginal prolapse, unspecified: Secondary | ICD-10-CM | POA: Diagnosis not present

## 2020-09-28 DIAGNOSIS — N952 Postmenopausal atrophic vaginitis: Secondary | ICD-10-CM | POA: Diagnosis not present

## 2020-09-28 DIAGNOSIS — Z4689 Encounter for fitting and adjustment of other specified devices: Secondary | ICD-10-CM | POA: Diagnosis not present

## 2020-09-29 DIAGNOSIS — N952 Postmenopausal atrophic vaginitis: Secondary | ICD-10-CM | POA: Diagnosis not present

## 2020-09-29 DIAGNOSIS — N814 Uterovaginal prolapse, unspecified: Secondary | ICD-10-CM | POA: Diagnosis not present

## 2020-09-29 DIAGNOSIS — Z4689 Encounter for fitting and adjustment of other specified devices: Secondary | ICD-10-CM | POA: Diagnosis not present

## 2020-09-30 DIAGNOSIS — Z8781 Personal history of (healed) traumatic fracture: Secondary | ICD-10-CM | POA: Diagnosis not present

## 2020-09-30 DIAGNOSIS — Z9889 Other specified postprocedural states: Secondary | ICD-10-CM | POA: Diagnosis not present

## 2020-11-22 DIAGNOSIS — I442 Atrioventricular block, complete: Secondary | ICD-10-CM | POA: Diagnosis not present

## 2020-11-28 DIAGNOSIS — Z45010 Encounter for checking and testing of cardiac pacemaker pulse generator [battery]: Secondary | ICD-10-CM | POA: Diagnosis not present

## 2020-11-28 DIAGNOSIS — I442 Atrioventricular block, complete: Secondary | ICD-10-CM | POA: Diagnosis not present

## 2020-11-28 DIAGNOSIS — I10 Essential (primary) hypertension: Secondary | ICD-10-CM | POA: Diagnosis not present

## 2020-11-28 DIAGNOSIS — Z950 Presence of cardiac pacemaker: Secondary | ICD-10-CM | POA: Diagnosis not present

## 2020-12-01 ENCOUNTER — Encounter: Payer: Self-pay | Admitting: Family Medicine

## 2020-12-01 ENCOUNTER — Ambulatory Visit (INDEPENDENT_AMBULATORY_CARE_PROVIDER_SITE_OTHER): Payer: Medicare Other | Admitting: Family Medicine

## 2020-12-01 ENCOUNTER — Other Ambulatory Visit: Payer: Self-pay

## 2020-12-01 VITALS — BP 142/88 | HR 84 | Temp 98.3°F | Ht 63.0 in | Wt 148.0 lb

## 2020-12-01 DIAGNOSIS — R059 Cough, unspecified: Secondary | ICD-10-CM

## 2020-12-01 DIAGNOSIS — B3789 Other sites of candidiasis: Secondary | ICD-10-CM | POA: Diagnosis not present

## 2020-12-01 DIAGNOSIS — J0100 Acute maxillary sinusitis, unspecified: Secondary | ICD-10-CM | POA: Diagnosis not present

## 2020-12-01 MED ORDER — AMOXICILLIN 500 MG PO CAPS: 500.0000 mg | ORAL_CAPSULE | Freq: Three times a day (TID) | ORAL | 0 refills | Status: DC

## 2020-12-01 MED ORDER — MUPIROCIN 2 % EX OINT: 1.0000 "application " | TOPICAL_OINTMENT | Freq: Two times a day (BID) | CUTANEOUS | 0 refills | Status: DC

## 2020-12-01 MED ORDER — BENZONATATE 200 MG PO CAPS: 200.0000 mg | ORAL_CAPSULE | Freq: Two times a day (BID) | ORAL | 0 refills | Status: DC | PRN

## 2020-12-01 MED ORDER — NYSTATIN 100000 UNIT/GM EX CREA: 1.0000 "application " | TOPICAL_CREAM | Freq: Two times a day (BID) | CUTANEOUS | 1 refills | Status: DC

## 2020-12-01 NOTE — Progress Notes (Signed)
Date:  12/01/2020   Name:  Teresa Mays   DOB:  [birth year]   MRN:  [id number]   Chief Complaint: Cough (Started Saturday - clear mucous. )  Cough This is a new problem. The current episode started in the past 7 days. The problem has been waxing and waning. The cough is productive of purulent sputum. Associated symptoms include nasal congestion, rhinorrhea and a sore throat. Pertinent negatives include no chest pain, chills, ear congestion, ear pain, eye redness, fever, headaches, heartburn, hemoptysis, myalgias, postnasal drip, rash, shortness of breath, sweats, weight loss or wheezing. Exacerbated by: mucinex/ The treatment provided moderate relief. Her past medical history is significant for bronchitis. There is no history of emphysema, environmental allergies or pneumonia.  Sinusitis This is a chronic problem. The current episode started more than 1 year ago. The problem has been waxing and waning since onset. There has been no fever. The pain is mild. Associated symptoms include congestion, coughing, sinus pressure and a sore throat. Pertinent negatives include no chills, diaphoresis, ear pain, headaches, hoarse voice, neck pain, shortness of breath or sneezing.    Lab Results  Component Value Date   CREATININE 0.94 08/30/2020   BUN 18 08/30/2020   NA 139 08/30/2020   K 4.2 08/30/2020   CL 105 08/30/2020   CO2 25 08/30/2020   No results found for: CHOL, HDL, LDLCALC, LDLDIRECT, TRIG, CHOLHDL No results found for: TSH No results found for: HGBA1C Lab Results  Component Value Date   WBC 6.0 08/30/2020   HGB 15.6 (H) 08/30/2020   HCT 48.2 (H) 08/30/2020   MCV 93.4 08/30/2020   PLT 135 (L) 08/30/2020   Lab Results  Component Value Date   ALT 18 08/30/2020   AST 33 08/30/2020   ALKPHOS 75 08/30/2020   BILITOT 1.3 (H) 08/30/2020     Review of Systems  Constitutional: Negative.  Negative for chills, diaphoresis, fatigue, fever, unexpected weight change and weight  loss.  HENT: Positive for congestion, rhinorrhea, sinus pressure and sore throat. Negative for ear discharge, ear pain, hoarse voice, postnasal drip and sneezing.   Eyes: Negative for photophobia, pain, discharge, redness and itching.  Respiratory: Positive for cough. Negative for hemoptysis, shortness of breath, wheezing and stridor.   Cardiovascular: Negative for chest pain.  Gastrointestinal: Negative for abdominal pain, blood in stool, constipation, diarrhea, heartburn, nausea and vomiting.  Endocrine: Negative for cold intolerance, heat intolerance, polydipsia, polyphagia and polyuria.  Genitourinary: Negative for dysuria, flank pain, frequency, hematuria, menstrual problem, pelvic pain, urgency, vaginal bleeding and vaginal discharge.  Musculoskeletal: Negative for arthralgias, back pain, myalgias and neck pain.  Skin: Negative for rash.  Allergic/Immunologic: Negative for environmental allergies and food allergies.  Neurological: Negative for dizziness, weakness, light-headedness, numbness and headaches.  Hematological: Negative for adenopathy. Does not bruise/bleed easily.  Psychiatric/Behavioral: Negative for dysphoric mood. The patient is not nervous/anxious.     Patient Active Problem List   Diagnosis Date Noted  . Complete heart block (HCC) 08/10/2020  . Mastalgia 04/25/2016  . Bladder cystocele 11/11/2015  . BP (high blood pressure) 01/27/2014  . Encounter for checking and testing of cardiac pacemaker pulse generator (battery) 01/27/2014    Allergies  Allergen Reactions  . Epinephrine Anaphylaxis and Other (See Comments)    Pass out  . Sulfa Antibiotics Anaphylaxis and Shortness Of Breath  . Mirabegron     Other reaction(s): Dizziness Constipation, headache, high-blood pressure, exhaustion, blurred vision, GI upset.  . Pregabalin Other (See Comments)  BREATHING     Past Surgical History:  Procedure Laterality Date  . ABDOMINAL HYSTERECTOMY     40 years ago  .  BREAST BIOPSY Left 1987   neg  . BREAST SURGERY  1990   Fibroadenom right   . COLONOSCOPY  2005  . ORIF WRIST FRACTURE Left 09/01/2020   Procedure: OPEN REDUCTION INTERNAL FIXATION (ORIF) WRIST FRACTURE;  Surgeon: Kennedy Bucker, MD;  Location: [facility] OR[facility];  Service: Orthopedics;  Laterality: Left;  . PACEMAKER INSERTION  [id number]  . PACEMAKER LEADLESS INSERTION N/A 08/10/2020   Procedure: PACEMAKER LEADLESS INSERTION;  Surgeon: Marcina Millard, MD;  Location: [facility];  Service: Cardiovascular;  Laterality: N/A;  . PPM GENERATOR REMOVAL N/A 08/10/2020   Procedure: PPM GENERATOR REMOVAL;  Surgeon: Marcina Millard, MD;  Location: [facility];  Service: Cardiovascular;  Laterality: N/A;    Social History   Tobacco Use  . Smoking status: Never Smoker  . Smokeless tobacco: Never Used  Vaping Use  . Vaping Use: Never used  Substance Use Topics  . Alcohol use: No    Alcohol/week: 0.0 standard drinks  . Drug use: No     Medication list has been reviewed and updated.  Current Meds  Medication Sig  . Docusate Calcium (STOOL SOFTENER PO) Take 100 mg by mouth at bedtime.  . NORVASC 2.5 MG tablet Take 2.5 tablets by mouth at bedtime. Dr Darrold Junker  . OXYQUINOLONE SULFATE VAGINAL (TRIMO-SAN) 0.025 % GEL Place 1 application vaginally 3 (three) times a week.    Current Facility-Administered Medications for the 12/01/20 encounter (Office Visit) with Duanne Limerick, MD  Medication  . albuterol (PROVENTIL) (2.5 MG/3ML) 0.083% nebulizer solution 2.5 mg    PHQ 2/9 Scores 12/01/2020 12/31/2019 12/28/2019 06/14/2017  PHQ - 2 Score 0 0 0 0  PHQ- 9 Score 0 0 - 0    GAD 7 : Generalized Anxiety Score 12/01/2020 12/31/2019  Nervous, Anxious, on Edge 0 0  Control/stop worrying 0 0  Worry too much - different things 0 0  Trouble relaxing 0 0  Restless 0 0  Easily annoyed or irritable 0 0  Afraid - awful might happen 0 0  Total GAD 7 Score 0 0  Anxiety Difficulty Not  difficult at all -    BP Readings from Last 3 Encounters:  12/01/20 (!) 142/88  09/01/20 (!) 146/72  08/30/20 (!) 143/89    Physical Exam Vitals and nursing note reviewed.  Constitutional:      Appearance: She is well-developed.  HENT:     Head: Normocephalic.     Right Ear: External ear normal.     Left Ear: External ear normal.     Nose: Nose normal.     Mouth/Throat:     Mouth: Mucous membranes are moist.  Eyes:     General: Lids are everted, no foreign bodies appreciated. No scleral icterus.       Left eye: No foreign body or hordeolum.     Conjunctiva/sclera: Conjunctivae normal.     Right eye: Right conjunctiva is not injected.     Left eye: Left conjunctiva is not injected.     Pupils: Pupils are equal, round, and reactive to light.  Neck:     Thyroid: No thyromegaly.     Vascular: No JVD.     Trachea: No tracheal deviation.  Cardiovascular:     Rate and Rhythm: Normal rate and regular rhythm.     Heart sounds: Normal heart sounds. No  murmur heard. No friction rub. No gallop.   Pulmonary:     Effort: Pulmonary effort is normal. No respiratory distress.     Breath sounds: Normal breath sounds. No wheezing, rhonchi or rales.  Abdominal:     General: Bowel sounds are normal.     Palpations: Abdomen is soft. There is no mass.     Tenderness: There is no abdominal tenderness. There is no guarding or rebound.  Musculoskeletal:        General: No tenderness. Normal range of motion.     Cervical back: Normal range of motion and neck supple.  Lymphadenopathy:     Cervical: No cervical adenopathy.  Skin:    General: Skin is warm.     Findings: No rash.  Neurological:     Mental Status: She is alert and oriented to person, place, and time.     Cranial Nerves: No cranial nerve deficit.     Deep Tendon Reflexes: Reflexes normal.  Psychiatric:        Mood and Affect: Mood is not anxious or depressed.     Wt Readings from Last 3 Encounters:  12/01/20 148 lb (67.1  kg)  09/01/20 147 lb 11.3 oz (67 kg)  08/30/20 147 lb (66.7 kg)    BP (!) 142/88   Pulse 84   Temp 98.3 F (36.8 C) (Oral)   Ht 5\' 3"  (1.6 m)   Wt 148 lb (67.1 kg)   SpO2 97%   BMI 26.22 kg/m   Assessment and Plan:  1. Acute maxillary sinusitis, recurrence not specified Acute.  Persistent.  Stable.  Patient has symptoms and exam consistent with an acute maxillary sinusitis.  Will initiate amoxicillin 500 mg 3 times a day. - amoxicillin (AMOXIL) 500 MG capsule; Take 1 capsule (500 mg total) by mouth 3 (three) times daily.  Dispense: 30 capsule; Refill: 0  2. Cough Patient has a cough but that is not significantly out of control.  Patient may take over-the-counter Mucinex with DM in addition Tessalon Perles 200 mg 2 times as needed as needed cough - benzonatate (TESSALON) 200 MG capsule; Take 1 capsule (200 mg total) by mouth 2 (two) times daily as needed for cough.  Dispense: 20 capsule; Refill: 0  3. Candidiasis of breast New onset.  Patient has a rash beneath each breast that is erythematous pruritic and discomfort with stretching of the skin and foul-smelling.  This is a secondarily infected candidiasis infection and we will treat with combination of nystatin cream with Bactroban topically initially. - nystatin cream (MYCOSTATIN); Apply 1 application topically 2 (two) times daily.  Dispense: 60 g; Refill: 1 - mupirocin ointment (BACTROBAN) 2 %; Apply 1 application topically 2 (two) times daily.  Dispense: 22 g; Refill: 0

## 2020-12-20 ENCOUNTER — Telehealth: Payer: Self-pay

## 2020-12-20 NOTE — Telephone Encounter (Signed)
Son called and said head is "full" since going outdoors yesterday. Told him she just had round of Amoxil on March 31st, and it is allergies from pollen.

## 2020-12-21 DIAGNOSIS — Z4689 Encounter for fitting and adjustment of other specified devices: Secondary | ICD-10-CM | POA: Diagnosis not present

## 2020-12-21 DIAGNOSIS — N814 Uterovaginal prolapse, unspecified: Secondary | ICD-10-CM | POA: Diagnosis not present

## 2020-12-21 DIAGNOSIS — N952 Postmenopausal atrophic vaginitis: Secondary | ICD-10-CM | POA: Diagnosis not present

## 2020-12-28 ENCOUNTER — Ambulatory Visit (INDEPENDENT_AMBULATORY_CARE_PROVIDER_SITE_OTHER): Payer: Medicare Other

## 2020-12-28 DIAGNOSIS — Z0000 Encounter for general adult medical examination without abnormal findings: Secondary | ICD-10-CM | POA: Diagnosis not present

## 2020-12-28 NOTE — Progress Notes (Signed)
Subjective:   Teresa Mays is a 87 y.o. female who presents for Medicare Annual (Subsequent) preventive examination.  Virtual Visit via Telephone Note  I connected with  Teresa Mays on 12/28/20 at  2:40 PM EDT by telephone and verified that I am speaking with the correct person using two identifiers.  Location: Patient: home Provider: [facility] Persons participating in the virtual visit: patient/Nurse Health Advisor   I discussed the limitations, risks, security and privacy concerns of performing an evaluation and management service by telephone and the availability of in person appointments. The patient expressed understanding and agreed to proceed.  Interactive audio and video telecommunications were attempted between this nurse and patient, however failed, due to patient having technical difficulties OR patient did not have access to video capability.  We continued and completed visit with audio only.  Some vital signs may be absent or patient reported.   Reather Littler, LPN    Review of Systems     Cardiac Risk Factors include: advanced age (>61men, >47 women);hypertension     Objective:    There were no vitals filed for this visit. There is no height or weight on file to calculate BMI.  Advanced Directives 12/28/2020 08/30/2020 08/10/2020 08/10/2020 12/28/2019 09/07/2017  Does Patient Have a Medical Advance Directive? Yes Yes Yes Yes Yes Yes  Type of Estate agent of [place];Living will Healthcare Power of [place];Living will Healthcare Power of [employer] - Healthcare Power of [place];Living will -  Does patient want to make changes to medical advance directive? - - No - Patient declined - - -  Copy of Healthcare Power of Attorney in Chart? No - copy requested - No - copy requested - No - copy requested -    Current Medications (verified) Outpatient Encounter Medications as of 12/28/2020  Medication Sig  . Docusate Calcium (STOOL SOFTENER PO)  Take 100 mg by mouth at bedtime.  . mupirocin ointment (BACTROBAN) 2 % Apply 1 application topically 2 (two) times daily.  . NORVASC 2.5 MG tablet Take 2.5 tablets by mouth at bedtime. Dr Darrold Junker  . nystatin cream (MYCOSTATIN) Apply 1 application topically 2 (two) times daily.  Dani Gobble SULFATE VAGINAL (TRIMO-SAN) 0.025 % GEL Place 1 application vaginally 3 (three) times a week.   . [DISCONTINUED] amoxicillin (AMOXIL) 500 MG capsule Take 1 capsule (500 mg total) by mouth 3 (three) times daily.  . [DISCONTINUED] benzonatate (TESSALON) 200 MG capsule Take 1 capsule (200 mg total) by mouth 2 (two) times daily as needed for cough.   Facility-Administered Encounter Medications as of 12/28/2020  Medication  . albuterol (PROVENTIL) (2.5 MG/3ML) 0.083% nebulizer solution 2.5 mg    Allergies (verified) Epinephrine, Sulfa antibiotics, Mirabegron, and Pregabalin   History: Past Medical History:  Diagnosis Date  . Bladder prolapse, female, acquired   . Hypertension    Past Surgical History:  Procedure Laterality Date  . ABDOMINAL HYSTERECTOMY     40 years ago  . BREAST BIOPSY Left 1987   neg  . BREAST SURGERY  1990   Fibroadenom right   . COLONOSCOPY  2005  . ORIF WRIST FRACTURE Left 09/01/2020   Procedure: OPEN REDUCTION INTERNAL FIXATION (ORIF) WRIST FRACTURE;  Surgeon: Kennedy Bucker, MD;  Location: [facility] OR[facility];  Service: Orthopedics;  Laterality: Left;  . PACEMAKER INSERTION  [id number]  . PACEMAKER LEADLESS INSERTION N/A 08/10/2020   Procedure: PACEMAKER LEADLESS INSERTION;  Surgeon: Marcina Millard, MD;  Location: [facility];  Service: Cardiovascular;  Laterality: N/A;  .  PPM GENERATOR REMOVAL N/A 08/10/2020   Procedure: PPM GENERATOR REMOVAL;  Surgeon: Marcina Millard, MD;  Location: [facility];  Service: Cardiovascular;  Laterality: N/A;   Family History  Problem Relation Age of Onset  . Breast cancer Neg Hx    Social History   Socioeconomic  History  . Marital status: Widowed    Spouse name: Not on file  . Number of children: Not on file  . Years of education: Not on file  . Highest education level: Bachelor's degree (e.g., BA, AB, BS)  Occupational History  . Occupation: retired  Tobacco Use  . Smoking status: Never Smoker  . Smokeless tobacco: Never Used  Vaping Use  . Vaping Use: Never used  Substance and Sexual Activity  . Alcohol use: No    Alcohol/week: 0.0 standard drinks  . Drug use: No  . Sexual activity: Never  Other Topics Concern  . Not on file  Social History Narrative   Pt lives alone. Husband passed away in 07-23-2013.       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   Social Determinants of Health   Financial Resource Strain: Low Risk   . Difficulty of Paying Living Expenses: Not hard at all  Food Insecurity: No Food Insecurity  . Worried About Programme researcher, broadcasting/film/video in the Last Year: Never true  . Ran Out of Food in the Last Year: Never true  Transportation Needs: No Transportation Needs  . Lack of Transportation (Medical): No  . Lack of Transportation (Non-Medical): No  Physical Activity: Inactive  . Days of Exercise per Week: 0 days  . Minutes of Exercise per Session: 0 min  Stress: No Stress Concern Present  . Feeling of Stress : Not at all  Social Connections: Socially Isolated  . Frequency of Communication with Friends and Family: More than three times a week  . Frequency of Social Gatherings with Friends and Family: More than three times a week  . Attends Religious Services: Never  . Active Member of Clubs or Organizations: No  . Attends Banker Meetings: Never  . Marital Status: Widowed    Tobacco Counseling Counseling given: Not Answered   Clinical Intake:  Pre-visit preparation completed: Yes  Pain : No/denies pain     Nutritional Risks: None Diabetes: No  How often do you need to have someone help you when you read instructions,  pamphlets, or other written materials from your doctor or pharmacy?: 1 - Never    Interpreter Needed?: No  Information entered by :: Reather Littler LPN   Activities of Daily Living In your present state of health, do you have any difficulty performing the following activities: 12/28/2020 08/10/2020  Hearing? N N  Comment declines hearing aids -  Vision? N N  Comment - -  Difficulty concentrating or making decisions? N N  Walking or climbing stairs? Y Y  Dressing or bathing? N N  Doing errands, shopping? N N  Preparing Food and eating ? N -  Using the Toilet? N -  In the past six months, have you accidently leaked urine? Y -  Do you have problems with loss of bowel control? N -  Managing your Medications? N -  Managing your Finances? N -  Housekeeping or managing your Housekeeping? N -  Some recent data might be hidden    Patient Care Team: Duanne Limerick, MD as PCP - General (Family Medicine) Marcina Millard, MD as  Consulting Physician (Cardiology) Lavonna RuaHanson, Cassandra Gerwig, NP as Nurse Practitioner (Gynecology)  Indicate any recent Medical Services you may have received from other than [facility] providers in the past year (date may be approximate).     Assessment:   This is a routine wellness examination for Teresa Mays.  Hearing/Vision screen  Hearing Screening   125Hz  250Hz  500Hz  1000Hz  2000Hz  3000Hz  4000Hz  6000Hz  8000Hz   Right ear:           Left ear:           Comments: Pt denies hearing difficulty  Vision Screening Comments: [facility]  Dietary issues and exercise activities discussed: Current Exercise Habits: The patient does not participate in regular exercise at present, Exercise limited by: orthopedic condition(s)  Goals    . Increase physical activity     Recommend increasing physical activity as tolerated       Depression Screen PHQ 2/9 Scores 12/28/2020 12/01/2020 12/31/2019 12/28/2019 06/14/2017 11/11/2015  PHQ - 2  Score 0 0 0 0 0 0  PHQ- 9 Score - 0 0 - 0 -    Fall Risk Fall Risk  12/28/2020 12/01/2020 12/01/2020 12/31/2019 12/28/2019  Falls in the past year? 1 1 0 0 0  Number falls in past yr: 0 0 0 - 0  Injury with Fall? 1 1 0 - 0  Risk for fall due to : Impaired balance/gait;History of fall(s) History of fall(s);Impaired balance/gait - - Impaired balance/gait  Follow up Falls prevention discussed Falls evaluation completed Falls evaluation completed Falls evaluation completed Falls prevention discussed    FALL RISK PREVENTION PERTAINING TO THE HOME:  Any stairs in or around the home? No  If so, are there any without handrails? No  Home free of loose throw rugs in walkways, pet beds, electrical cords, etc? Yes  Adequate lighting in your home to reduce risk of falls? Yes   ASSISTIVE DEVICES UTILIZED TO PREVENT FALLS:  Life alert? Yes  Use of a cane, walker or w/c? Yes  Grab bars in the bathroom? No  Shower chair or bench in shower? No  Elevated toilet seat or a handicapped toilet? Yes   TIMED UP AND GO:  Was the test performed? No . Telephonic visit.   Cognitive Function: Normal cognitive status assessed by direct observation by this Nurse Health Advisor. No abnormalities found.       6CIT Screen 12/28/2019  What Year? 0 points  What month? 0 points  What time? 0 points  Count back from 20 0 points  Months in reverse 0 points  Repeat phrase 0 points  Total Score 0    Immunizations Immunization History  Administered Date(s) Administered  . Influenza-Unspecified 06/17/2014  . PFIZER(Purple Top)SARS-COV-2 Vaccination 10/02/2019, 10/23/2019  . Zoster Recombinat (Shingrix) 08/26/2019, 11/12/2019    TDAP status: Due, Education has been provided regarding the importance of this vaccine. Advised may receive this vaccine at local pharmacy or Health Dept. Aware to provide a copy of the vaccination record if obtained from local pharmacy or Health Dept. Verbalized acceptance and  understanding.  Flu Vaccine status: Declined, Education has been provided regarding the importance of this vaccine but patient still declined. Advised may receive this vaccine at local pharmacy or Health Dept. Aware to provide a copy of the vaccination record if obtained from local pharmacy or Health Dept. Verbalized acceptance and understanding.  Pneumococcal vaccine status: Declined,  Education has been provided regarding the importance of this vaccine but patient still declined.  Advised may receive this vaccine at local pharmacy or Health Dept. Aware to provide a copy of the vaccination record if obtained from local pharmacy or Health Dept. Verbalized acceptance and understanding.   Covid-19 vaccine status: Completed vaccines. Pt advised to bring copy of vaccine record for booster information.   Qualifies for Shingles Vaccine? Yes   Zostavax completed No   Shingrix Completed?: Yes  Screening Tests Health Maintenance  Topic Date Due  . DEXA SCAN  Never done  . MAMMOGRAM  02/04/2020  . COVID-19 Vaccine (3 - Booster for Pfizer series) 04/21/2020  . TETANUS/TDAP  12/30/2020 (Originally 12/21/1946)  . PNA vac Low Risk Adult (1 of 2 - PCV13) 12/30/2020 (Originally 12/22/1994)  . INFLUENZA VACCINE  04/03/2021  . HPV VACCINES  Aged Out    Health Maintenance  Health Maintenance Due  Topic Date Due  . DEXA SCAN  Never done  . MAMMOGRAM  02/04/2020  . COVID-19 Vaccine (3 - Booster for Pfizer series) 04/21/2020    Colorectal cancer screening: No longer required.   Mammogram status: Completed 03/03/19. Repeat every year. Ordered 07/13/20  Bone density status: no longer required  Lung Cancer Screening: (Low Dose CT Chest recommended if Age 62-80 years, 30 pack-year currently smoking OR have quit w/in 15years.) does not qualify.   Additional Screening:  Hepatitis C Screening: does not qualify.  Vision Screening: Recommended annual ophthalmology exams for early detection of glaucoma and  other disorders of the eye. Is the patient up to date with their annual eye exam?  Yes  Who is the provider or what is the name of the office in which the patient attends annual eye exams? Dr. Brooke Dare.   Dental Screening: Recommended annual dental exams for proper oral hygiene  Community Resource Referral / Chronic Care Management: CRR required this visit?  No   CCM required this visit?  No      Plan:     I have personally reviewed and noted the following in the patient's chart:   . Medical and social history . Use of alcohol, tobacco or illicit drugs  . Current medications and supplements . Functional ability and status . Nutritional status . Physical activity . Advanced directives . List of other physicians . Hospitalizations, surgeries, and ER visits in previous 12 months . Vitals . Screenings to include cognitive, depression, and falls . Referrals and appointments  In addition, I have reviewed and discussed with patient certain preventive protocols, quality metrics, and best practice recommendations. A written personalized care plan for preventive services as well as general preventive health recommendations were provided to patient.     Reather Littler, LPN   [id number]   Nurse Notes: none

## 2020-12-28 NOTE — Patient Instructions (Signed)
[name] , Thank you for taking time to come for your Medicare Wellness Visit. I appreciate your ongoing commitment to your health goals. Please review the following plan we discussed and let me know if I can assist you in the future.   Screening recommendations/referrals: Colonoscopy: no longer required Mammogram: done 03/03/19. Please call [phone] to schedule your mammogram.  Bone Density: no longer required Recommended yearly ophthalmology/optometry visit for glaucoma screening and checkup Recommended yearly dental visit for hygiene and checkup  Vaccinations: Influenza vaccine: declined Pneumococcal vaccine: declined Tdap vaccine: due Shingles vaccine: done 08/26/19 & 11/12/19   Covid-19: done 10/02/19, 10/23/19  Advanced directives: Please bring a copy of your health care power of attorney and living will to the office at your convenience.  Conditions/risks identified: Recommend increasing physical activity   Next appointment: Follow up in one year for your annual wellness visit    Preventive Care 65 Years and Older, Female Preventive care refers to lifestyle choices and visits with your health care provider that can promote health and wellness. What does preventive care include?  A yearly physical exam. This is also called an annual well check.  Dental exams once or twice a year.  Routine eye exams. Ask your health care provider how often you should have your eyes checked.  Personal lifestyle choices, including:  Daily care of your teeth and gums.  Regular physical activity.  Eating a healthy diet.  Avoiding tobacco and drug use.  Limiting alcohol use.  Practicing safe sex.  Taking low-dose aspirin every day.  Taking vitamin and mineral supplements as recommended by your health care provider. What happens during an annual well check? The services and screenings done by your health care provider during your annual well check will depend on your age, overall  health, lifestyle risk factors, and family history of disease. Counseling  Your health care provider may ask you questions about your:  Alcohol use.  Tobacco use.  Drug use.  Emotional well-being.  Home and relationship well-being.  Sexual activity.  Eating habits.  History of falls.  Memory and ability to understand (cognition).  Work and work Astronomer.  Reproductive health. Screening  You may have the following tests or measurements:  Height, weight, and BMI.  Blood pressure.  Lipid and cholesterol levels. These may be checked every 5 years, or more frequently if you are over 69 years old.  Skin check.  Lung cancer screening. You may have this screening every year starting at age 88 if you have a 30-pack-year history of smoking and currently smoke or have quit within the past 15 years.  Fecal occult blood test (FOBT) of the stool. You may have this test every year starting at age 60.  Flexible sigmoidoscopy or colonoscopy. You may have a sigmoidoscopy every 5 years or a colonoscopy every 10 years starting at age 62.  Hepatitis C blood test.  Hepatitis B blood test.  Sexually transmitted disease (STD) testing.  Diabetes screening. This is done by checking your blood sugar (glucose) after you have not eaten for a while (fasting). You may have this done every 1-3 years.  Bone density scan. This is done to screen for osteoporosis. You may have this done starting at age 57.  Mammogram. This may be done every 1-2 years. Talk to your health care provider about how often you should have regular mammograms. Talk with your health care provider about your test results, treatment options, and if necessary, the need for more tests. Vaccines  Your health care provider may recommend certain vaccines, such as:  Influenza vaccine. This is recommended every year.  Tetanus, diphtheria, and acellular pertussis (Tdap, Td) vaccine. You may need a Td booster every 10  years.  Zoster vaccine. You may need this after age 65.  Pneumococcal 13-valent conjugate (PCV13) vaccine. One dose is recommended after age 14.  Pneumococcal polysaccharide (PPSV23) vaccine. One dose is recommended after age 45. Talk to your health care provider about which screenings and vaccines you need and how often you need them. This information is not intended to replace advice given to you by your health care provider. Make sure you discuss any questions you have with your health care provider. Document Released: 09/16/2015 Document Revised: 05/09/2016 Document Reviewed: 06/21/2015 Elsevier Interactive Patient Education  2017 [place] Prevention in the Home Falls can cause injuries. They can happen to people of all ages. There are many things you can do to make your home safe and to help prevent falls. What can I do on the outside of my home?  Regularly fix the edges of walkways and driveways and fix any cracks.  Remove anything that might make you trip as you walk through a door, such as a raised step or threshold.  Trim any bushes or trees on the path to your home.  Use bright outdoor lighting.  Clear any walking paths of anything that might make someone trip, such as rocks or tools.  Regularly check to see if handrails are loose or broken. Make sure that both sides of any steps have handrails.  Any raised decks and porches should have guardrails on the edges.  Have any leaves, snow, or ice cleared regularly.  Use sand or salt on walking paths during winter.  Clean up any spills in your garage right away. This includes oil or grease spills. What can I do in the bathroom?  Use night lights.  Install grab bars by the toilet and in the tub and shower. Do not use towel bars as grab bars.  Use non-skid mats or decals in the tub or shower.  If you need to sit down in the shower, use a plastic, non-slip stool.  Keep the floor dry. Clean up any water that  spills on the floor as soon as it happens.  Remove soap buildup in the tub or shower regularly.  Attach bath mats securely with double-sided non-slip rug tape.  Do not have throw rugs and other things on the floor that can make you trip. What can I do in the bedroom?  Use night lights.  Make sure that you have a light by your bed that is easy to reach.  Do not use any sheets or blankets that are too big for your bed. They should not hang down onto the floor.  Have a firm chair that has side arms. You can use this for support while you get dressed.  Do not have throw rugs and other things on the floor that can make you trip. What can I do in the kitchen?  Clean up any spills right away.  Avoid walking on wet floors.  Keep items that you use a lot in easy-to-reach places.  If you need to reach something above you, use a strong step stool that has a grab bar.  Keep electrical cords out of the way.  Do not use floor polish or wax that makes floors slippery. If you must use wax, use non-skid floor wax.  Do  not have throw rugs and other things on the floor that can make you trip. What can I do with my stairs?  Do not leave any items on the stairs.  Make sure that there are handrails on both sides of the stairs and use them. Fix handrails that are broken or loose. Make sure that handrails are as long as the stairways.  Check any carpeting to make sure that it is firmly attached to the stairs. Fix any carpet that is loose or worn.  Avoid having throw rugs at the top or bottom of the stairs. If you do have throw rugs, attach them to the floor with carpet tape.  Make sure that you have a light switch at the top of the stairs and the bottom of the stairs. If you do not have them, ask someone to add them for you. What else can I do to help prevent falls?  Wear shoes that:  Do not have high heels.  Have rubber bottoms.  Are comfortable and fit you well.  Are closed at the  toe. Do not wear sandals.  If you use a stepladder:  Make sure that it is fully opened. Do not climb a closed stepladder.  Make sure that both sides of the stepladder are locked into place.  Ask someone to hold it for you, if possible.  Clearly mark and make sure that you can see:  Any grab bars or handrails.  First and last steps.  Where the edge of each step is.  Use tools that help you move around (mobility aids) if they are needed. These include:  Canes.  Walkers.  Scooters.  Crutches.  Turn on the lights when you go into a dark area. Replace any light bulbs as soon as they burn out.  Set up your furniture so you have a clear path. Avoid moving your furniture around.  If any of your floors are uneven, fix them.  If there are any pets around you, be aware of where they are.  Review your medicines with your doctor. Some medicines can make you feel dizzy. This can increase your chance of falling. Ask your doctor what other things that you can do to help prevent falls. This information is not intended to replace advice given to you by your health care provider. Make sure you discuss any questions you have with your health care provider. Document Released: 06/16/2009 Document Revised: 01/26/2016 Document Reviewed: 09/24/2014 Elsevier Interactive Patient Education  2017 [employer].

## 2021-01-07 ENCOUNTER — Encounter: Payer: Self-pay | Admitting: Emergency Medicine

## 2021-01-07 ENCOUNTER — Emergency Department
Admission: EM | Admit: 2021-01-07 | Discharge: 2021-01-07 | Disposition: A | Discharge status: Home / Self Care | Payer: Medicare Other | Attending: Emergency Medicine | Admitting: Emergency Medicine

## 2021-01-07 ENCOUNTER — Other Ambulatory Visit: Payer: Self-pay

## 2021-01-07 DIAGNOSIS — Z950 Presence of cardiac pacemaker: Secondary | ICD-10-CM | POA: Insufficient documentation

## 2021-01-07 DIAGNOSIS — R0902 Hypoxemia: Secondary | ICD-10-CM | POA: Diagnosis not present

## 2021-01-07 DIAGNOSIS — R231 Pallor: Secondary | ICD-10-CM | POA: Diagnosis not present

## 2021-01-07 DIAGNOSIS — R42 Dizziness and giddiness: Secondary | ICD-10-CM | POA: Insufficient documentation

## 2021-01-07 DIAGNOSIS — I10 Essential (primary) hypertension: Secondary | ICD-10-CM | POA: Diagnosis not present

## 2021-01-07 DIAGNOSIS — Z20822 Contact with and (suspected) exposure to covid-19: Secondary | ICD-10-CM | POA: Insufficient documentation

## 2021-01-07 DIAGNOSIS — R55 Syncope and collapse: Secondary | ICD-10-CM

## 2021-01-07 LAB — CBC WITH DIFFERENTIAL/PLATELET
Abs Immature Granulocytes: 0.01 10*3/uL (ref 0.00–0.07)
Basophils Absolute: 0 10*3/uL (ref 0.0–0.1)
Basophils Relative: 1 %
Eosinophils Absolute: 0.2 10*3/uL (ref 0.0–0.5)
Eosinophils Relative: 4 %
HCT: 43.1 % (ref 36.0–46.0)
Hemoglobin: 14.2 g/dL (ref 12.0–15.0)
Immature Granulocytes: 0 %
Lymphocytes Relative: 21 %
Lymphs Abs: 0.9 10*3/uL (ref 0.7–4.0)
MCH: 31 pg (ref 26.0–34.0)
MCHC: 32.9 g/dL (ref 30.0–36.0)
MCV: 94.1 fL (ref 80.0–100.0)
Monocytes Absolute: 0.2 10*3/uL (ref 0.1–1.0)
Monocytes Relative: 5 %
Neutro Abs: 3 10*3/uL (ref 1.7–7.7)
Neutrophils Relative %: 69 %
Platelets: 123 10*3/uL — ABNORMAL LOW (ref 150–400)
RBC: 4.58 MIL/uL (ref 3.87–5.11)
RDW: 13.3 % (ref 11.5–15.5)
WBC: 4.3 10*3/uL (ref 4.0–10.5)
nRBC: 0 % (ref 0.0–0.2)

## 2021-01-07 LAB — URINALYSIS, COMPLETE (UACMP) WITH MICROSCOPIC
Bacteria, UA: NONE SEEN
Bilirubin Urine: NEGATIVE
Glucose, UA: NEGATIVE mg/dL
Ketones, ur: NEGATIVE mg/dL
Leukocytes,Ua: NEGATIVE
Nitrite: NEGATIVE
Protein, ur: NEGATIVE mg/dL
Specific Gravity, Urine: 1.012 (ref 1.005–1.030)
pH: 6 (ref 5.0–8.0)

## 2021-01-07 LAB — BASIC METABOLIC PANEL
Anion gap: 7 (ref 5–15)
BUN: 23 mg/dL (ref 8–23)
CO2: 28 mmol/L (ref 22–32)
Calcium: 9.4 mg/dL (ref 8.9–10.3)
Chloride: 101 mmol/L (ref 98–111)
Creatinine, Ser: 1.2 mg/dL — ABNORMAL HIGH (ref 0.44–1.00)
GFR, Estimated: 43 mL/min — ABNORMAL LOW (ref >60)
Glucose, Bld: 166 mg/dL — ABNORMAL HIGH (ref 70–99)
Potassium: 4 mmol/L (ref 3.5–5.1)
Sodium: 136 mmol/L (ref 135–145)

## 2021-01-07 LAB — RESP PANEL BY RT-PCR (FLU A&B, COVID) ARPGX2
Influenza A by PCR: NEGATIVE
Influenza B by PCR: NEGATIVE
SARS Coronavirus 2 by RT PCR: NEGATIVE

## 2021-01-07 LAB — APTT: aPTT: 26 seconds (ref 24–36)

## 2021-01-07 LAB — PROTIME-INR
INR: 1.2 (ref 0.8–1.2)
Prothrombin Time: 14.8 seconds (ref 11.4–15.2)

## 2021-01-07 NOTE — ED Notes (Signed)
D/C discussed and reasons to return to ED discussed with pt, pt verbalized understanding. NAD noted. Pt states she "feels great".

## 2021-01-07 NOTE — ED Notes (Signed)
Lab called to run blood specimens that were sent

## 2021-01-07 NOTE — ED Notes (Signed)
Okey Regal with [employer] stated via phone report the patients pace maker device is appropriate and pacing at 90

## 2021-01-07 NOTE — ED Notes (Signed)
Pt 1 assist to bedside toilet to give urine sample.

## 2021-01-07 NOTE — ED Notes (Signed)
[employer] pacemaker interrogated, per [employer] device "test successful", MD aware.

## 2021-01-07 NOTE — ED Triage Notes (Signed)
Pt came ems from home; pt felt clammy before going to the bathroom but had a near syncope episode on the toilet then felt clammy again after;; never passed out cbg 147; hx HTN 149/64; has pace maker this is the third one;

## 2021-01-07 NOTE — ED Provider Notes (Signed)
[facility] Emergency Department Provider Note  ____________________________________________   Event Date/Time   First MD Initiated Contact with Patient 01/07/21 1038     (approximate)  I have reviewed the triage vital signs and the nursing notes.   HISTORY  Chief Complaint Near Syncope    HPI Teresa Mays is a 87 y.o. female history of previous heart block, leadless pacemaker, hypertension.  Patient reports she was having a bowel movement today, became very lightheaded, called for her son and he came into the room and assisted her.  She was seated on the toilet.  She had a large normal formed bowel movement.  She was nearly unconscious for a very brief period, son reports by the time EMS arrived she had already improved.  She was actually able to get up, clean herself after using the toilet and walked down the stairs.   She has had similar episodes in the past.    No recent illness.  Had her pacemaker replaced not too long ago, leadless.  Sees [facility].  Denies headache no fevers no chills no vomiting.  Been in her normal state of health, except she did feel a little bit constipated last couple days, but reports complete relief of that now.  No injury.  She did not fall.  Her son caught her.  Past Medical History:  Diagnosis Date  . Bladder prolapse, female, acquired   . Hypertension     Patient Active Problem List   Diagnosis Date Noted  . Complete heart block (HCC) 08/10/2020  . Mastalgia 04/25/2016  . Bladder cystocele 11/11/2015  . BP (high blood pressure) 01/27/2014  . Encounter for checking and testing of cardiac pacemaker pulse generator (battery) 01/27/2014    Past Surgical History:  Procedure Laterality Date  . ABDOMINAL HYSTERECTOMY     40 years ago  . BREAST BIOPSY Left 1987   neg  . BREAST SURGERY  1990   Fibroadenom right   . COLONOSCOPY  2005  . ORIF WRIST FRACTURE Left 09/01/2020   Procedure: OPEN REDUCTION  INTERNAL FIXATION (ORIF) WRIST FRACTURE;  Surgeon: Kennedy Bucker, MD;  Location: [facility] ORS;  Service: Orthopedics;  Laterality: Left;  . PACEMAKER INSERTION  [id number]  . PACEMAKER LEADLESS INSERTION N/A 08/10/2020   Procedure: PACEMAKER LEADLESS INSERTION;  Surgeon: Marcina Millard, MD;  Location: [facility] INVASIVE CV LAB;  Service: Cardiovascular;  Laterality: N/A;  . PPM GENERATOR REMOVAL N/A 08/10/2020   Procedure: PPM GENERATOR REMOVAL;  Surgeon: Marcina Millard, MD;  Location: [facility] INVASIVE CV LAB;  Service: Cardiovascular;  Laterality: N/A;    Prior to Admission medications   Medication Sig Start Date End Date Taking? Authorizing Provider  Docusate Calcium (STOOL SOFTENER PO) Take 100 mg by mouth at bedtime.    [provider]  mupirocin ointment (BACTROBAN) 2 % Apply 1 application topically 2 (two) times daily. 12/01/20   Duanne Limerick, MD  NORVASC 2.5 MG tablet Take 2.5 tablets by mouth at bedtime. Dr Darrold Junker 10/19/15   [provider]  nystatin cream (MYCOSTATIN) Apply 1 application topically 2 (two) times daily. 12/01/20   Duanne Limerick, MD  OXYQUINOLONE SULFATE VAGINAL (TRIMO-SAN) 0.025 % GEL Place 1 application vaginally 3 (three) times a week.  11/18/19   [provider]    Allergies Epinephrine, Sulfa antibiotics, Mirabegron, and Pregabalin  Family History  Problem Relation Age of Onset  . Breast cancer Neg Hx     Social History Social History   Tobacco Use  .  Smoking status: Never Smoker  . Smokeless tobacco: Never Used  Vaping Use  . Vaping Use: Never used  Substance Use Topics  . Alcohol use: No    Alcohol/week: 0.0 standard drinks  . Drug use: No    Review of Systems Constitutional: No fever/chills Eyes: No visual changes. ENT: No sore throat. Cardiovascular: Denies chest pain. Respiratory: Denies shortness of breath. Gastrointestinal: No abdominal pain.   Genitourinary: Negative for dysuria. Musculoskeletal: Negative  for back pain. Skin: Negative for rash. Neurological: Negative for headaches, areas of focal weakness or numbness.    ____________________________________________   PHYSICAL EXAM:  VITAL SIGNS: ED Triage Vitals  Enc Vitals Group     BP 01/07/21 0916 (!) 152/58     Pulse Rate 01/07/21 0916 63     Resp 01/07/21 0916 16     Temp 01/07/21 0916 (!) 97.5 F (36.4 C)     Temp Source 01/07/21 0916 Oral     SpO2 01/07/21 0916 99 %     Weight 01/07/21 0917 142 lb (64.4 kg)     Height 01/07/21 0917 5\' 2"  (1.575 m)     Head Circumference --      Peak Flow --      Pain Score 01/07/21 0917 5     Pain Loc --      Pain Edu? --      Excl. in GC? --     Constitutional: Alert and oriented. Well appearing and in no acute distress.  Both patient and her son very pleasant Eyes: Conjunctivae are normal. Head: Atraumatic. Nose: No congestion/rhinnorhea. Mouth/Throat: Mucous membranes are moist. Neck: No stridor.  Cardiovascular: Normal rate, regular rhythm. Grossly normal heart sounds except for a slightly prominent S2 versus mild regurgitation murmur.  Good peripheral circulation. Respiratory: Normal respiratory effort.  No retractions. Lungs CTAB. Gastrointestinal: Soft and nontender. No distention. Musculoskeletal: No lower extremity tenderness nor edema. Neurologic:  Normal speech and language. No gross focal neurologic deficits are appreciated.  Skin:  Skin is warm, dry and intact. No rash noted. Psychiatric: Mood and affect are normal. Speech and behavior are normal.  ____________________________________________   LABS (all labs ordered are listed, but only abnormal results are displayed)  Labs Reviewed  BASIC METABOLIC PANEL - Abnormal; Notable for the following components:      Result Value   Glucose, Bld 166 (*)    Creatinine, Ser 1.20 (*)    GFR, Estimated 43 (*)    All other components within normal limits  CBC WITH DIFFERENTIAL/PLATELET - Abnormal; Notable for the  following components:   Platelets 123 (*)    All other components within normal limits  URINALYSIS, COMPLETE (UACMP) WITH MICROSCOPIC - Abnormal; Notable for the following components:   Color, Urine YELLOW (*)    APPearance HAZY (*)    Hgb urine dipstick SMALL (*)    All other components within normal limits  RESP PANEL BY RT-PCR (FLU A&B, COVID) [id number]  PROTIME-INR  APTT  CBG MONITORING, ED   ____________________________________________  EKG  Reviewed inter by me at 920 Heart rate 69 QRS 169 QTc 500 Sinus rhythm, left bundle branch block.  ____________________________________________   PROCEDURES  Procedure(s) performed: None  Procedures  Critical Care performed: No  ____________________________________________   INITIAL IMPRESSION / ASSESSMENT AND PLAN / ED COURSE  Pertinent labs & imaging results that were available during my care of the patient were reviewed by me and considered in my medical decision making (see chart for details).  Patient returns for evaluation after near syncopal episode.  Good neurologic recovery quickly thereafter.  This occurred in the setting of having a large bowel movement.  Very reassuring clinical exam at this time.  Has advanced age and history of pacemaker, but no history of congestive heart failure, anemia, hypotension, or other acute conditions such as chest pain dyspnea etc.  She is fully alert and oriented without distress now.  She seems to have recovered well.  Very reassuring clinical exam.  Labs reassuring.  Notify Dr. Lady Gary of case and he advises that he will ensure follow-up with [facility] cardiology.    Patient observed in the ER, continues to be asymptomatic.  She has a very mild reduction in GFR.  Discussed with the patient as well as her son and offered observation and further work-up for her episode of near syncope versus discharge with close cardiology follow-up.  Pacemaker was interrogated here shows no evidence of  malfunction.  Patient would strongly like to be discharged she feels well, comfortable with follow-up.  Discussed careful return precautions.  I think this is reasonable especially in the context of COVID pandemic, patient wishes, and good care at home and follow-up available.  Return precautions and treatment recommendations and follow-up discussed with the patient who is agreeable with the plan.   ____________________________________________   FINAL CLINICAL IMPRESSION(S) / ED DIAGNOSES  Final diagnoses:  Near syncope        Note:  This document was prepared using Dragon voice recognition software and may include unintentional dictation errors       Sharyn Creamer, MD 01/07/21 1707

## 2021-02-12 IMAGING — CR DG FOREARM 2V*L*
1 series · 2 of 2 positions shown · non-contrast
Comparison: No prior.

CLINICAL DATA: Fall.  Left wrist pain and deformity.

EXAM:
LEFT FOREARM - 2 VIEW

[Series 1: dg forearm left · 0.14mm/px · 2 of 2 slices shown]
[im 1/2]
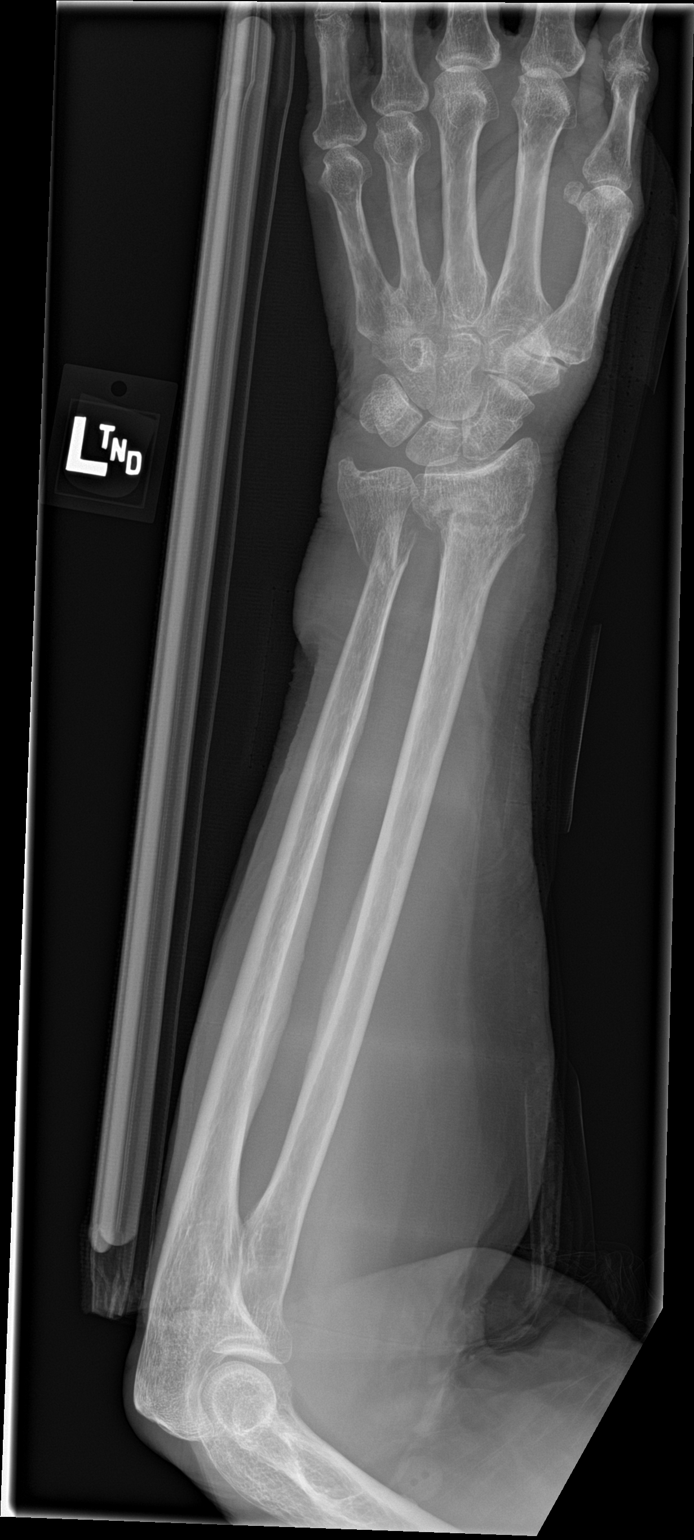
[im 2/2]
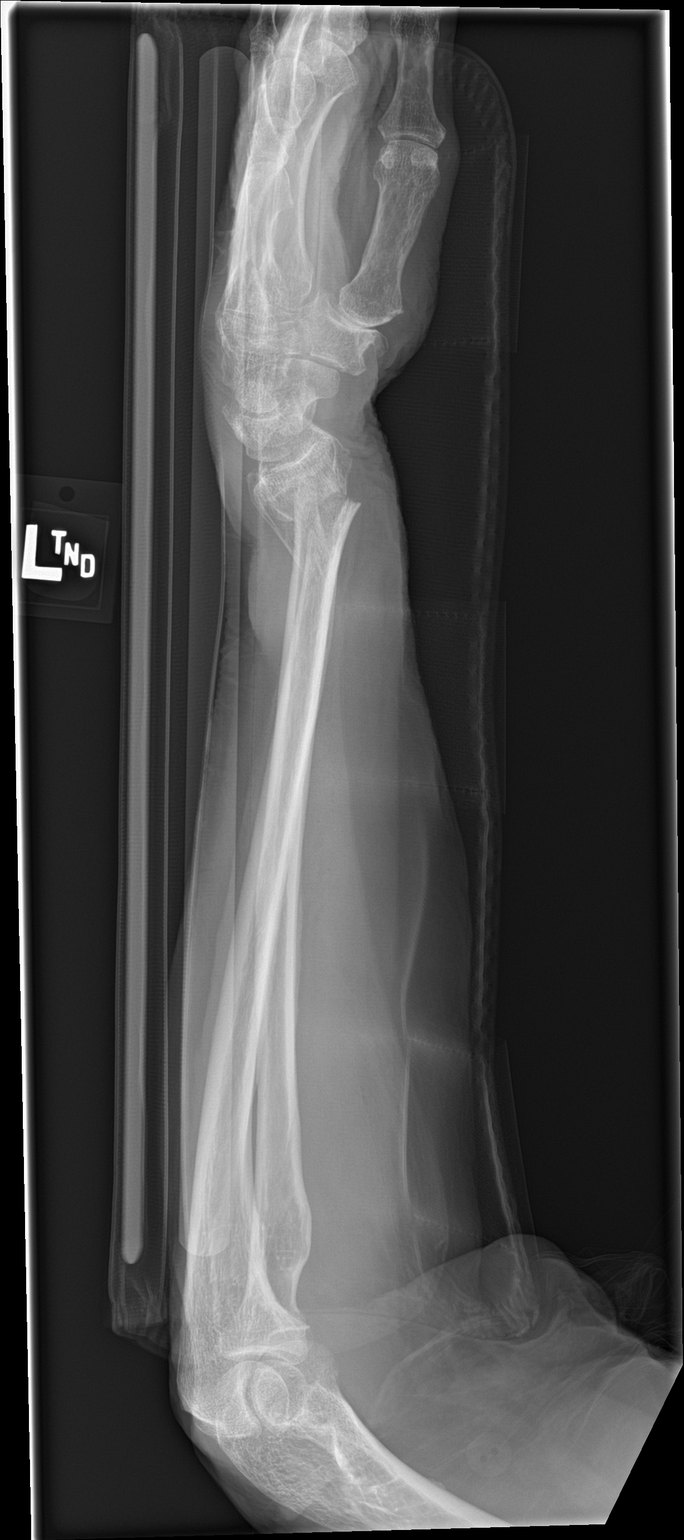

[2 of 2 positions shown; findings below may reference images not displayed]

FINDINGS: Diffuse osteopenia and degenerative change. Angulated displaced
fractures of the distal left radial and ulnar metaphyses noted.
IMPRESSION: Angulated displaced fractures of the distal left radial and ulnar
metaphyses.

## 2021-02-12 IMAGING — CT CT HEAD W/O CM
3 series · 16 of 47 positions shown, 19 images · non-contrast
Comparison: None.

CLINICAL DATA: Fall.  Head injury

EXAM:
CT HEAD WITHOUT CONTRAST
TECHNIQUE: Contiguous axial images were obtained from the base of the skull
through the vertex without intravenous contrast.

[Series 2: head wo · axial · 0.43mm/px · z∈[-147,-22]mm · 10 of 30 slices shown, 13 images]
[im 3/30  brain]
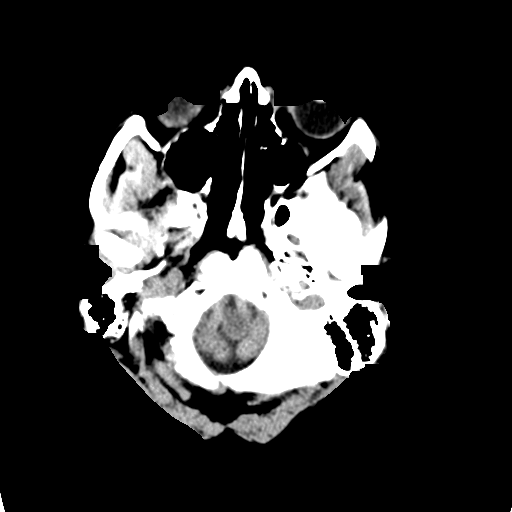
[im 3/30  bone]
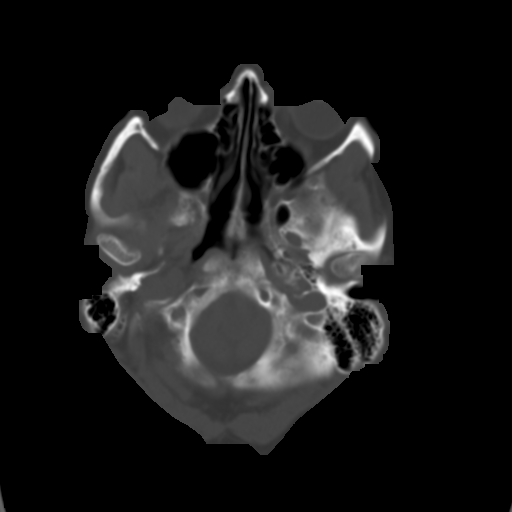
[im 6/30  brain]
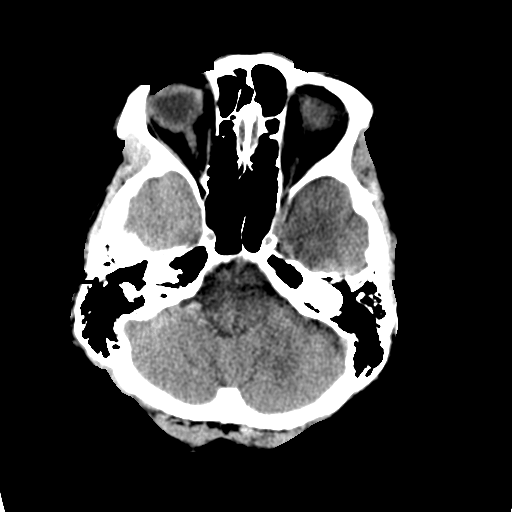
[im 9/30  brain]
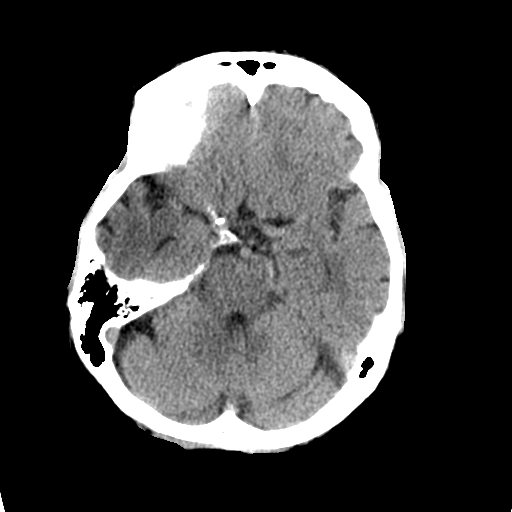
[im 11/30  brain]
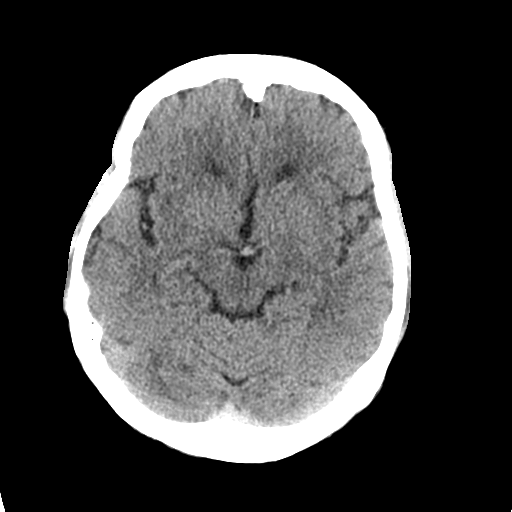
[im 14/30  brain]
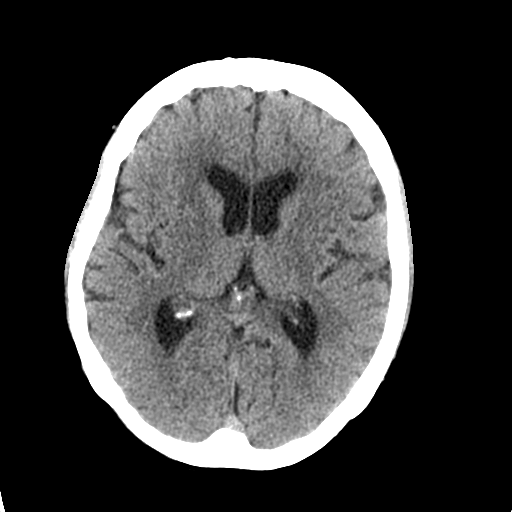
[im 14/30  bone]
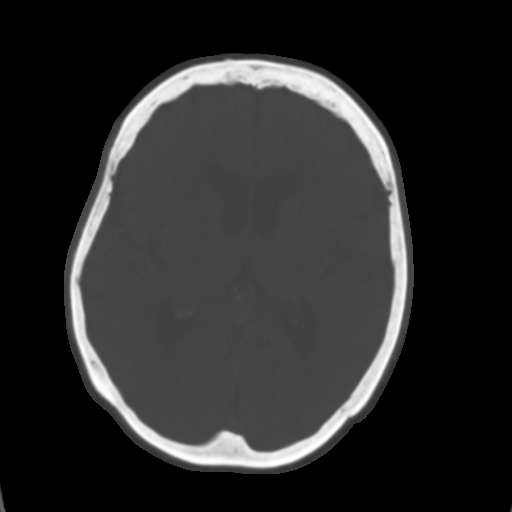
[im 17/30  brain]
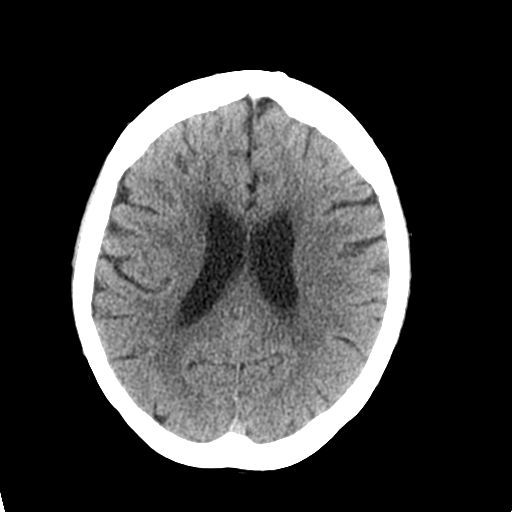
[im 20/30  brain]
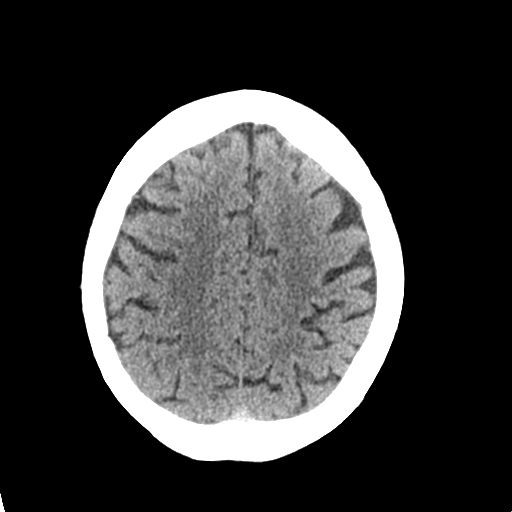
[im 23/30  brain]
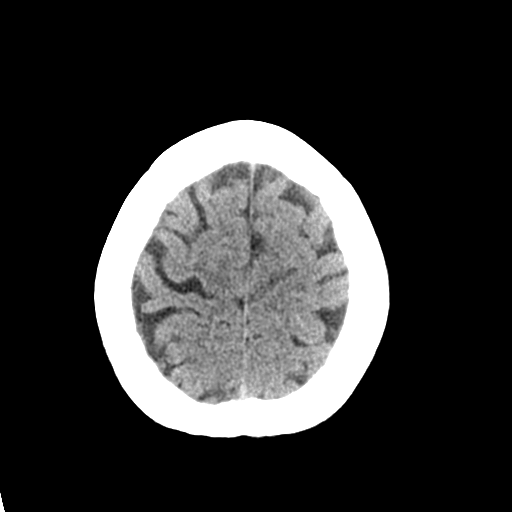
[im 25/30  brain]
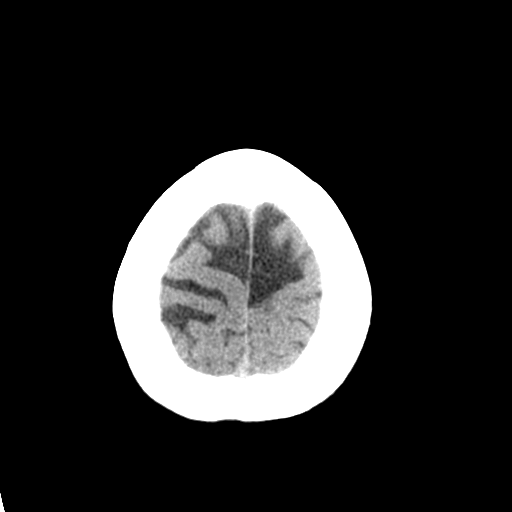
[im 25/30  bone]
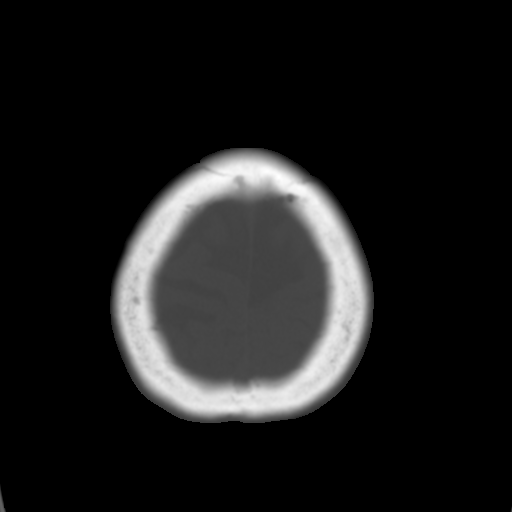
[im 28/30  brain]
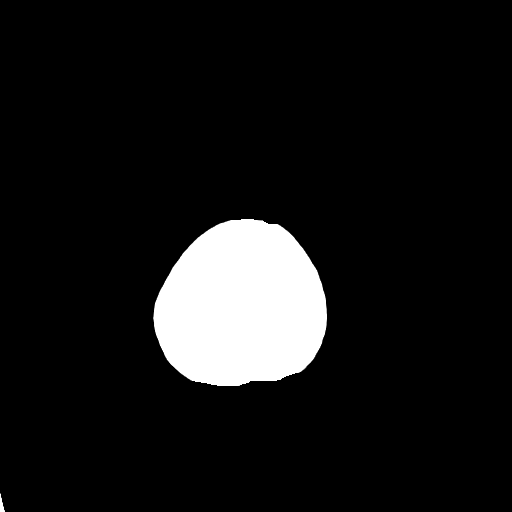

[Series 4: coronal soft tissue · coronal · 0.31mm/px · 3 of 62 slices shown]
[im 21/62  brain]
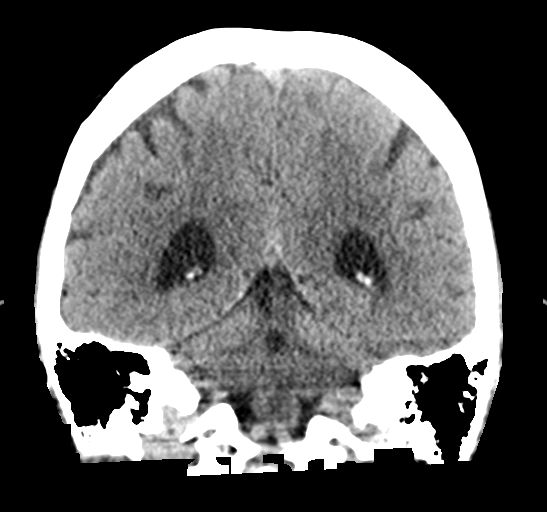
[im 28/62  brain]
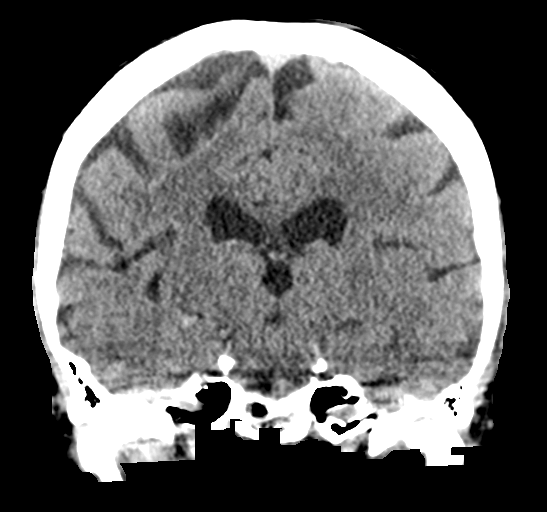
[im 34/62  brain]
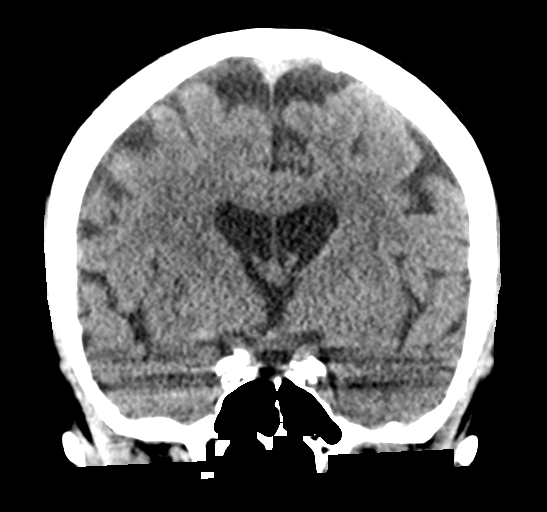

[Series 5: sagittal soft tissue · sagittal · 0.31mm/px · 3 of 56 slices shown]
[im 19/56  brain]
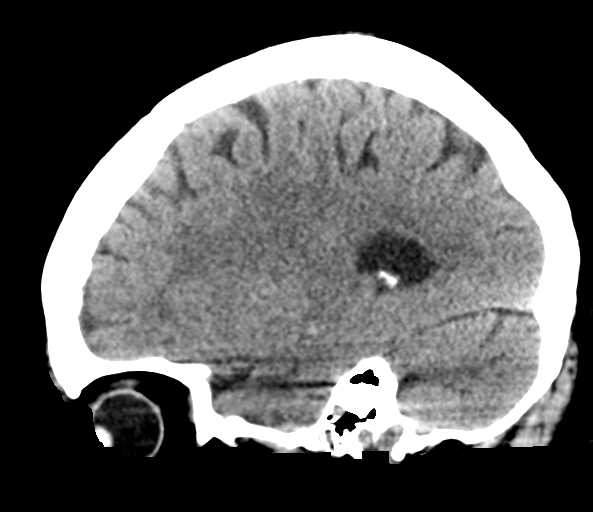
[im 28/56  brain]
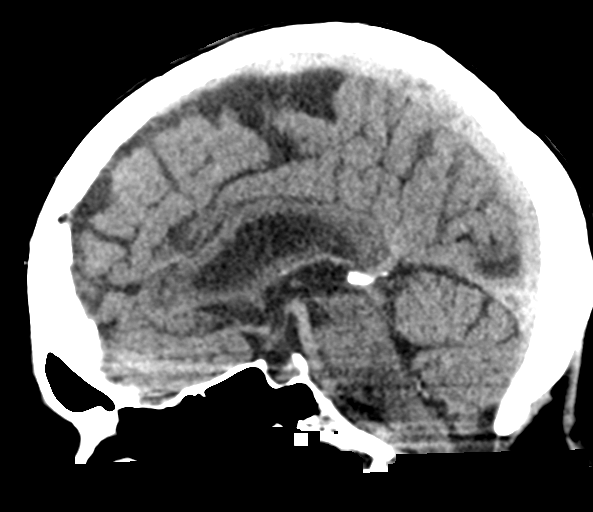
[im 37/56  brain]
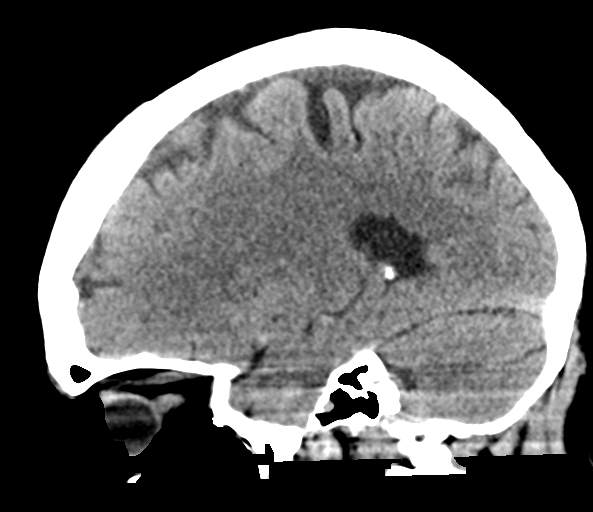

[16 of 47 positions shown; findings below may reference images not displayed]

FINDINGS: Brain: Ventricle size and cerebral volume normal for age. Negative
for acute infarct, hemorrhage, mass.

Vascular: Negative for hyperdense vessel

Skull: Negative for fracture.

Sinuses/Orbits: Paranasal sinuses clear. Prior ocular surgery on the
right.

Other: Laceration anteriorly in the midline in the forehead

Small bony exostosis projecting into the scalp left frontal region.
IMPRESSION: No acute intracranial abnormality

Laceration forehead.

## 2021-02-12 IMAGING — CR DG WRIST COMPLETE 3+V*L*
1 series · 3 of 3 positions shown · non-contrast
Comparison: None.

CLINICAL DATA: Fall

EXAM:
LEFT WRIST - COMPLETE 3+ VIEW

[Series 1: dg wrist complete left · 0.14mm/px · 3 of 3 slices shown]
[im 1/3]
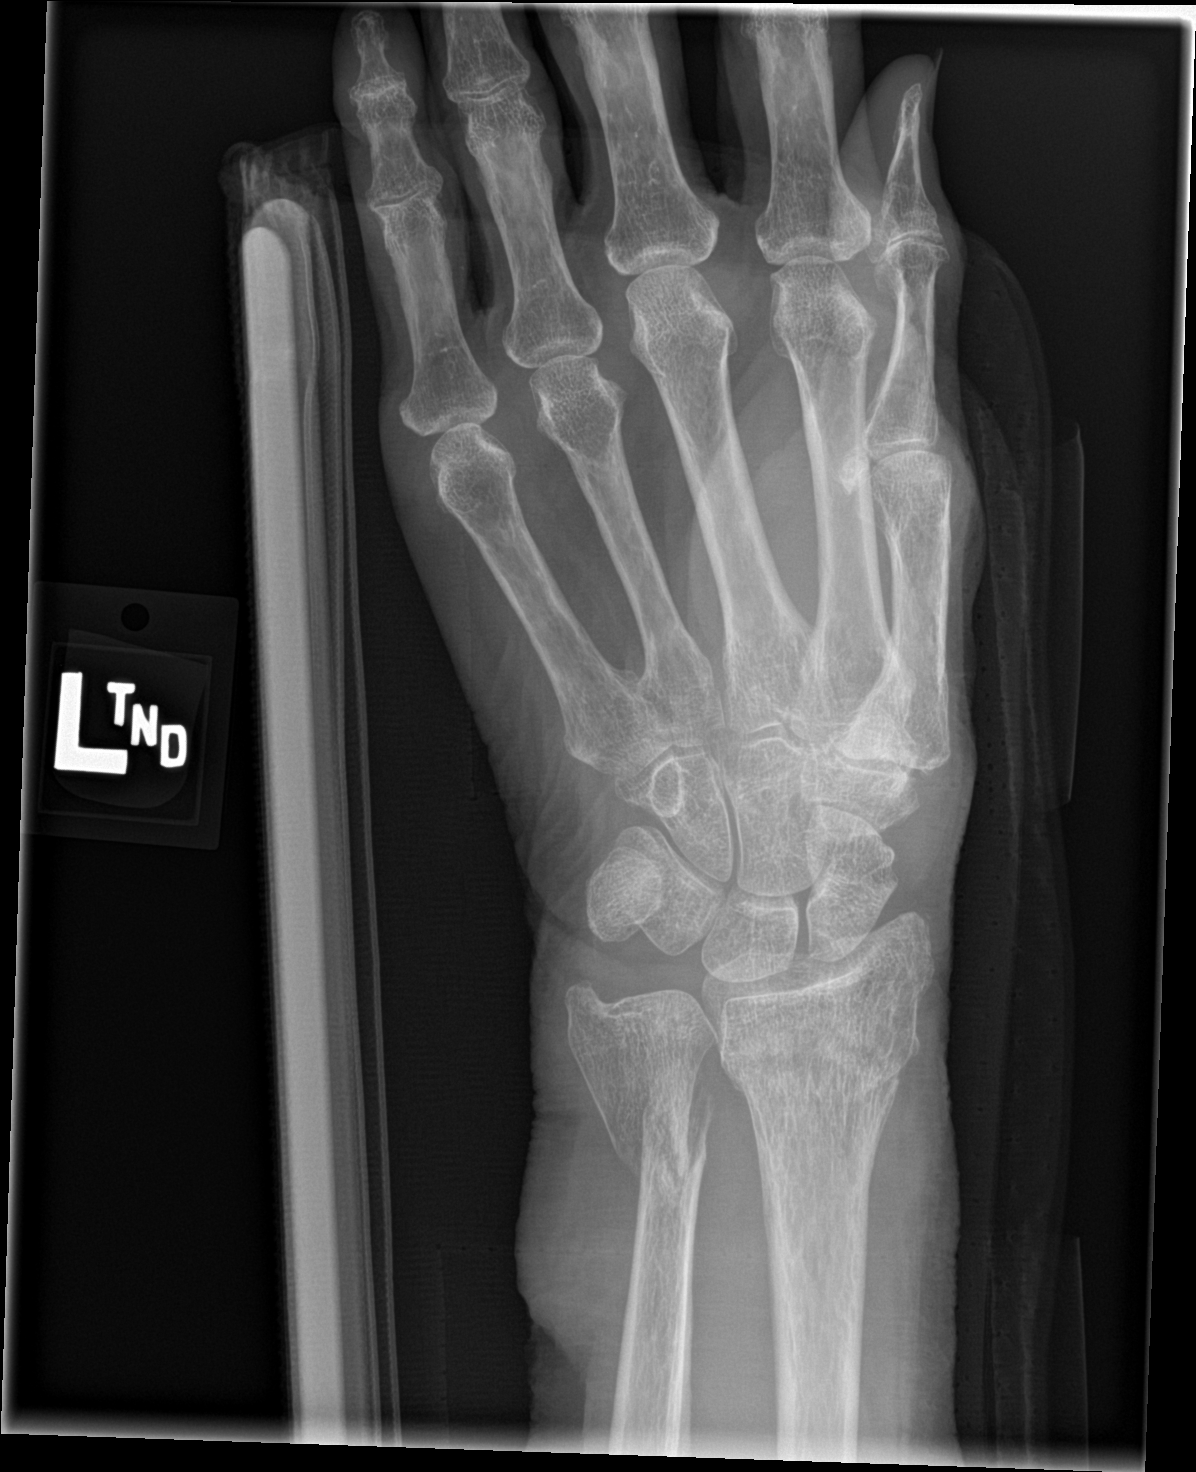
[im 2/3]
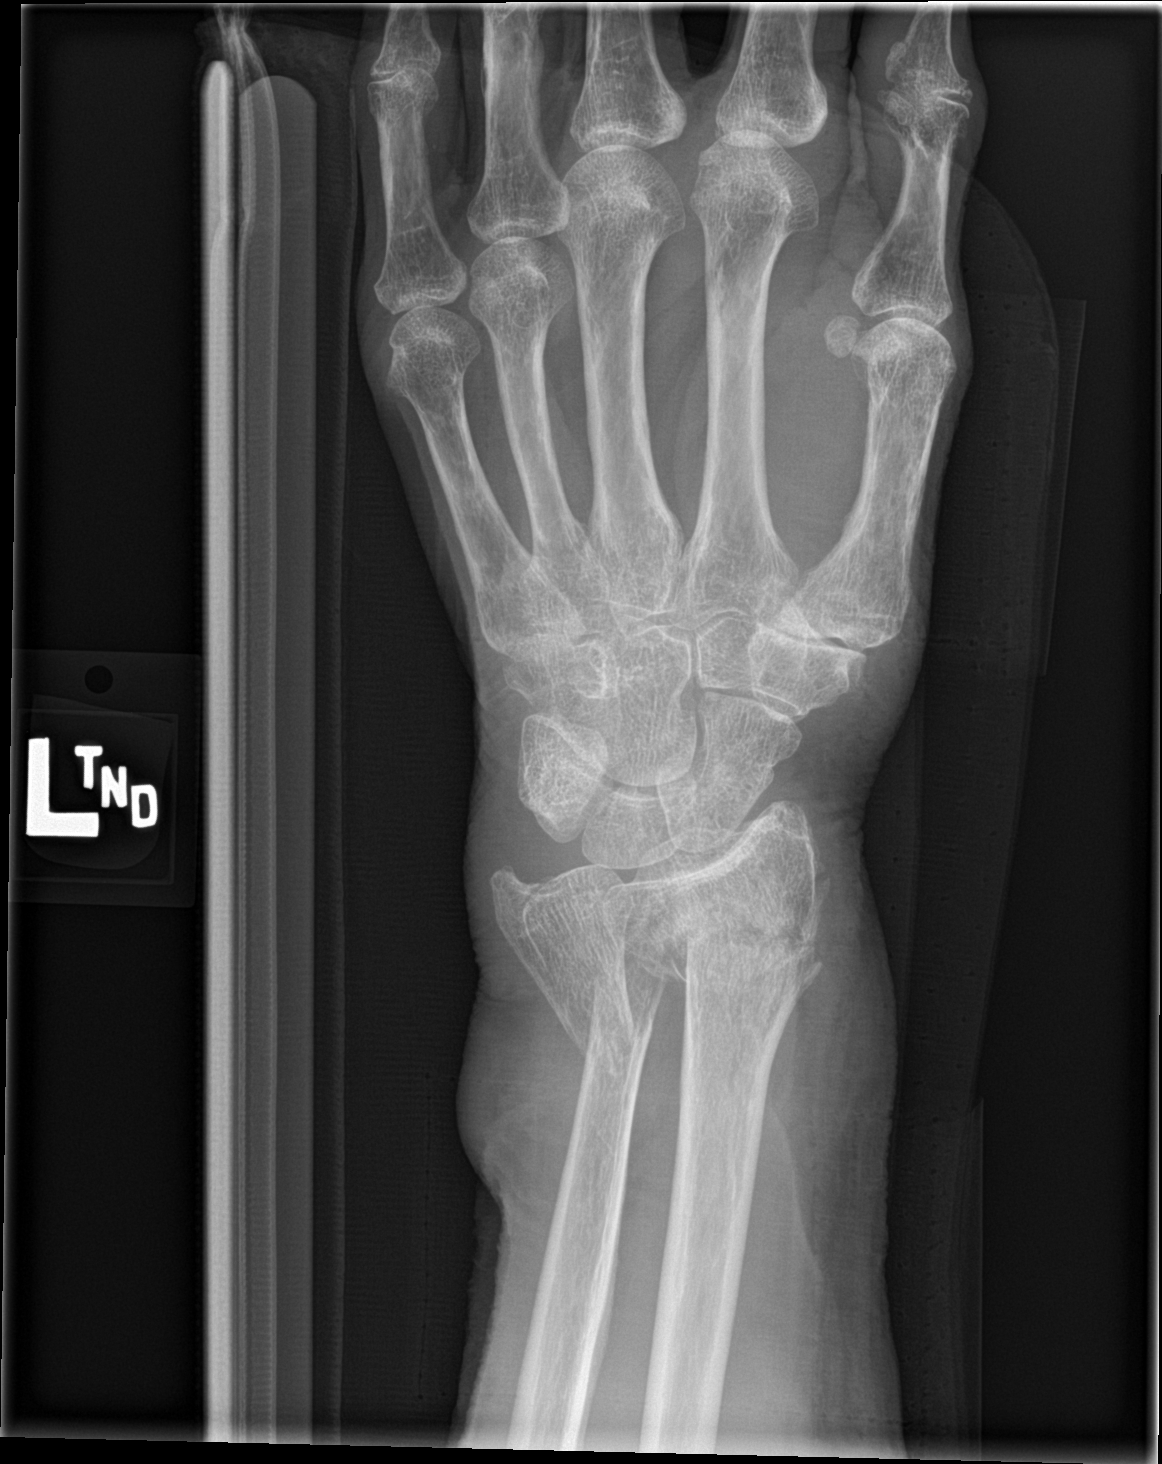
[im 3/3]
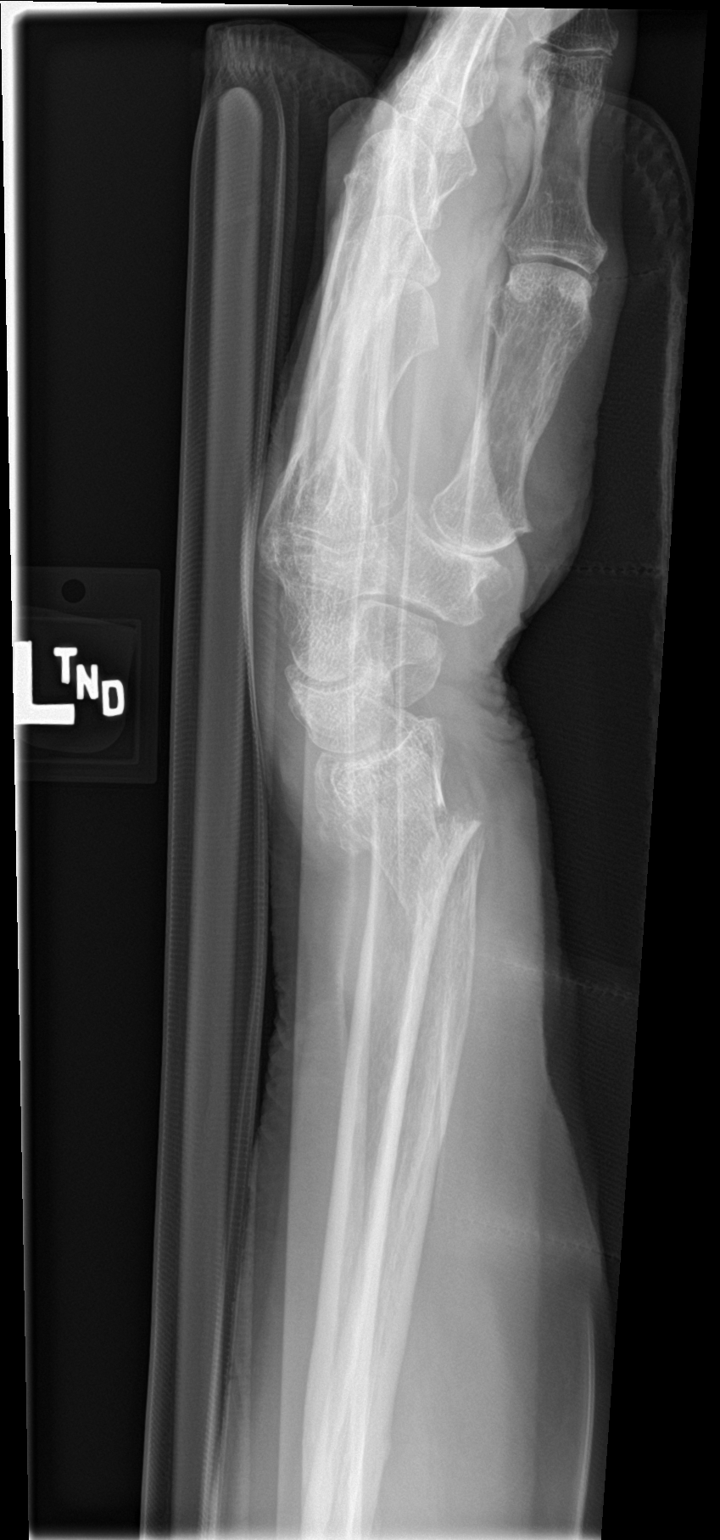

[3 of 3 positions shown; findings below may reference images not displayed]

FINDINGS: Comminuted fracture distal radius with dorsal displacement and
angulation. No definite intra-articular extension. Fracture distal
ulnar diaphysis also with angulation.

No carpal fracture. Mild degenerative change base of thumb.
IMPRESSION: Dorsally displaced and angulated fracture distal radius. Angulated
fracture distal ulna.

## 2021-02-14 DIAGNOSIS — Z950 Presence of cardiac pacemaker: Secondary | ICD-10-CM | POA: Diagnosis not present

## 2021-02-14 DIAGNOSIS — I10 Essential (primary) hypertension: Secondary | ICD-10-CM | POA: Diagnosis not present

## 2021-03-21 DIAGNOSIS — N814 Uterovaginal prolapse, unspecified: Secondary | ICD-10-CM | POA: Diagnosis not present

## 2021-03-21 DIAGNOSIS — N952 Postmenopausal atrophic vaginitis: Secondary | ICD-10-CM | POA: Diagnosis not present

## 2021-03-21 DIAGNOSIS — Z4689 Encounter for fitting and adjustment of other specified devices: Secondary | ICD-10-CM | POA: Diagnosis not present

## 2021-04-18 DIAGNOSIS — I442 Atrioventricular block, complete: Secondary | ICD-10-CM | POA: Diagnosis not present

## 2021-04-22 DIAGNOSIS — R102 Pelvic and perineal pain: Secondary | ICD-10-CM | POA: Diagnosis not present

## 2021-04-22 DIAGNOSIS — T839XXA Unspecified complication of genitourinary prosthetic device, implant and graft, initial encounter: Secondary | ICD-10-CM | POA: Diagnosis not present

## 2021-04-22 DIAGNOSIS — T83428A Displacement of other prosthetic devices, implants and grafts of genital tract, initial encounter: Secondary | ICD-10-CM | POA: Diagnosis not present

## 2021-05-19 DIAGNOSIS — H353131 Nonexudative age-related macular degeneration, bilateral, early dry stage: Secondary | ICD-10-CM | POA: Diagnosis not present

## 2021-05-19 DIAGNOSIS — H524 Presbyopia: Secondary | ICD-10-CM | POA: Diagnosis not present

## 2021-06-20 DIAGNOSIS — Z4689 Encounter for fitting and adjustment of other specified devices: Secondary | ICD-10-CM | POA: Diagnosis not present

## 2021-06-20 DIAGNOSIS — N814 Uterovaginal prolapse, unspecified: Secondary | ICD-10-CM | POA: Diagnosis not present

## 2021-06-20 DIAGNOSIS — N952 Postmenopausal atrophic vaginitis: Secondary | ICD-10-CM | POA: Diagnosis not present

## 2021-07-03 DIAGNOSIS — Z4689 Encounter for fitting and adjustment of other specified devices: Secondary | ICD-10-CM | POA: Diagnosis not present

## 2021-07-03 DIAGNOSIS — N814 Uterovaginal prolapse, unspecified: Secondary | ICD-10-CM | POA: Diagnosis not present

## 2021-07-03 DIAGNOSIS — N952 Postmenopausal atrophic vaginitis: Secondary | ICD-10-CM | POA: Diagnosis not present

## 2021-07-21 DIAGNOSIS — H2513 Age-related nuclear cataract, bilateral: Secondary | ICD-10-CM | POA: Diagnosis not present

## 2021-09-26 DIAGNOSIS — Z4689 Encounter for fitting and adjustment of other specified devices: Secondary | ICD-10-CM | POA: Diagnosis not present

## 2021-09-26 DIAGNOSIS — N952 Postmenopausal atrophic vaginitis: Secondary | ICD-10-CM | POA: Diagnosis not present

## 2021-09-26 DIAGNOSIS — N814 Uterovaginal prolapse, unspecified: Secondary | ICD-10-CM | POA: Diagnosis not present

## 2021-10-04 DIAGNOSIS — I442 Atrioventricular block, complete: Secondary | ICD-10-CM | POA: Diagnosis not present

## 2021-10-26 DIAGNOSIS — Z950 Presence of cardiac pacemaker: Secondary | ICD-10-CM | POA: Diagnosis not present

## 2021-10-26 DIAGNOSIS — I10 Essential (primary) hypertension: Secondary | ICD-10-CM | POA: Diagnosis not present

## 2021-11-01 DIAGNOSIS — I441 Atrioventricular block, second degree: Secondary | ICD-10-CM | POA: Diagnosis not present

## 2021-12-26 DIAGNOSIS — N952 Postmenopausal atrophic vaginitis: Secondary | ICD-10-CM | POA: Diagnosis not present

## 2021-12-26 DIAGNOSIS — Z4689 Encounter for fitting and adjustment of other specified devices: Secondary | ICD-10-CM | POA: Diagnosis not present

## 2021-12-26 DIAGNOSIS — N814 Uterovaginal prolapse, unspecified: Secondary | ICD-10-CM | POA: Diagnosis not present

## 2022-01-01 ENCOUNTER — Ambulatory Visit (INDEPENDENT_AMBULATORY_CARE_PROVIDER_SITE_OTHER): Payer: Medicare Other

## 2022-01-01 DIAGNOSIS — Z0000 Encounter for general adult medical examination without abnormal findings: Secondary | ICD-10-CM

## 2022-01-01 NOTE — Progress Notes (Signed)
? ?Subjective:  ? Teresa Mays is a 88 y.o. female who presents for Medicare Annual (Subsequent) preventive examination. ? ?Virtual Visit via Telephone Note ? ?I connected with  Teresa Mays on 01/01/22 at  3:00 PM EDT by telephone and verified that I am speaking with the correct person using two identifiers. ? ?Location: ?Patient: home ?Provider: [facility] ?Persons participating in the virtual visit: patient/Nurse Health Advisor ?  ?I discussed the limitations, risks, security and privacy concerns of performing an evaluation and management service by telephone and the availability of in person appointments. The patient expressed understanding and agreed to proceed. ? ?Interactive audio and video telecommunications were attempted between this nurse and patient, however failed, due to patient having technical difficulties OR patient did not have access to video capability.  We continued and completed visit with audio only. ? ?Some vital signs may be absent or patient reported.  ? ?Reather Littler, LPN ? ? ?Review of Systems    ? ?  ? ?   ?Objective:  ?  ?There were no vitals filed for this visit. ?There is no height or weight on file to calculate BMI. ? ? ?  01/01/2022  ?  4:20 PM 01/07/2021  ?  9:20 AM 12/28/2020  ?  3:01 PM 08/30/2020  ? 10:42 AM 08/10/2020  ?  5:37 PM 08/10/2020  ?  7:15 AM 12/28/2019  ?  2:33 PM  ?Advanced Directives  ?Does Patient Have a Medical Advance Directive? Yes Yes Yes Yes Yes Yes Yes  ?Type of Estate agent of [name];Living will Living will Healthcare Power of [place];Living will Healthcare Power of [place];Living will Healthcare Power of [employer] of [place];Living will  ?Does patient want to make changes to medical advance directive?  No - Patient declined   No - Patient declined    ?Copy of Healthcare Power of Attorney in Chart? No - copy requested  No - copy requested  No - copy requested  No - copy requested  ? ? ?Current Medications  (verified) ?Outpatient Encounter Medications as of 01/01/2022  ?Medication Sig  ? Docusate Calcium (STOOL SOFTENER PO) Take 100 mg by mouth at bedtime.  ? NORVASC 2.5 MG tablet Take 2.5 tablets by mouth at bedtime. Dr Darrold Junker  ? OXYQUINOLONE SULFATE VAGINAL (TRIMO-SAN) 0.025 % GEL Place 1 application vaginally 3 (three) times a week.   ? [DISCONTINUED] mupirocin ointment (BACTROBAN) 2 % Apply 1 application topically 2 (two) times daily.  ? [DISCONTINUED] nystatin cream (MYCOSTATIN) Apply 1 application topically 2 (two) times daily.  ? ?Facility-Administered Encounter Medications as of 01/01/2022  ?Medication  ? albuterol (PROVENTIL) (2.5 MG/3ML) 0.083% nebulizer solution 2.5 mg  ? ? ?Allergies (verified) ?Epinephrine, Sulfa antibiotics, Mirabegron, and Pregabalin  ? ?History: ?Past Medical History:  ?Diagnosis Date  ? Bladder prolapse, female, acquired   ? Hypertension   ? ?Past Surgical History:  ?Procedure Laterality Date  ? ABDOMINAL HYSTERECTOMY    ? 40 years ago  ? BREAST BIOPSY Left 1987  ? neg  ? BREAST SURGERY  1990  ? Fibroadenom right   ? COLONOSCOPY  2005  ? ORIF WRIST FRACTURE Left 09/01/2020  ? Procedure: OPEN REDUCTION INTERNAL FIXATION (ORIF) WRIST FRACTURE;  Surgeon: Kennedy Bucker, MD;  Location: [facility] ORS;  Service: Orthopedics;  Laterality: Left;  ? PACEMAKER INSERTION  [id number]  ? PACEMAKER LEADLESS INSERTION N/A 08/10/2020  ? Procedure: PACEMAKER LEADLESS INSERTION;  Surgeon: Marcina Millard, MD;  Location: [facility] INVASIVE CV LAB;  Service:  Cardiovascular;  Laterality: N/A;  ? PPM GENERATOR REMOVAL N/A 08/10/2020  ? Procedure: PPM GENERATOR REMOVAL;  Surgeon: Marcina MillardParaschos, Alexander, MD;  Location: [facility] INVASIVE CV LAB;  Service: Cardiovascular;  Laterality: N/A;  ? ?Family History  ?Problem Relation Age of Onset  ? Breast cancer Neg Hx   ? ?Social History  ? ?Socioeconomic History  ? Marital status: Widowed  ?  Spouse name: Not on file  ? Number of children: Not on file  ? Years of education: Not on  file  ? Highest education level: Bachelor's degree (e.g., BA, AB, BS)  ?Occupational History  ? Occupation: retired  ?Tobacco Use  ? Smoking status: Never  ? Smokeless tobacco: Never  ?Vaping Use  ? Vaping Use: Never used  ?Substance and Sexual Activity  ? Alcohol use: No  ?  Alcohol/week: 0.0 standard drinks  ? Drug use: No  ? Sexual activity: Never  ?Other Topics Concern  ? Not on file  ?Social History Narrative  ? Pt lives alone. Husband passed away in Nov 2014.   ?   ? 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  ? ?Social Determinants of Health  ? ?Financial Resource Strain: Low Risk   ? Difficulty of Paying Living Expenses: Not hard at all  ?Food Insecurity: No Food Insecurity  ? Worried About Programme researcher, broadcasting/film/videounning Out of Food in the Last Year: Never true  ? Ran Out of Food in the Last Year: Never true  ?Transportation Needs: No Transportation Needs  ? Lack of Transportation (Medical): No  ? Lack of Transportation (Non-Medical): No  ?Physical Activity: Inactive  ? Days of Exercise per Week: 0 days  ? Minutes of Exercise per Session: 0 min  ?Stress: No Stress Concern Present  ? Feeling of Stress : Not at all  ?Social Connections: Socially Isolated  ? Frequency of Communication with Friends and Family: More than three times a week  ? Frequency of Social Gatherings with Friends and Family: More than three times a week  ? Attends Religious Services: Never  ? Active Member of Clubs or Organizations: No  ? Attends BankerClub or Organization Meetings: Never  ? Marital Status: Widowed  ? ? ?Tobacco Counseling ?Counseling given: Not Answered ? ? ?Clinical Intake: ? ?Pre-visit preparation completed: Yes ? ?Pain : No/denies pain ? ?  ? ?Nutritional Risks: None ?Diabetes: No ? ?How often do you need to have someone help you when you read instructions, pamphlets, or other written materials from your doctor or pharmacy?: 1 - Never ? ? ? ?Interpreter Needed?: No ? ?Information entered by :: Reather LittlerKasey Tierra Thoma LPN ? ? ?Activities  of Daily Living ?   ? View : No data to display.  ?  ?  ?  ? ? ?Patient Care Team: ?Duanne LimerickJones, Deanna C, MD as PCP - General (Family Medicine) ?Marcina MillardParaschos, Alexander, MD as Consulting Physician (Cardiology) ?Lavonna RuaHanson, Cassandra Gerwig, NP as Nurse Practitioner (Gynecology) ? ?Indicate any recent Medical Services you may have received from other than [facility] providers in the past year (date may be approximate). ? ?   ?Assessment:  ? This is a routine wellness examination [facility]. ? ?Hearing/Vision screen ?Hearing Screening - Comments:: Pt denies hearing difficulty ?Vision Screening - Comments:: [facility] ? ?Dietary issues and exercise activities discussed: ?  ? ? Goals Addressed   ?None ?  ? ?Depression Screen ? ?  01/01/2022  ?  3:13 PM 12/28/2020  ?  2:58 PM  12/01/2020  ? 11:22 AM 12/31/2019  ? 10:18 AM 12/28/2019  ?  2:36 PM 06/14/2017  ?  3:16 PM 11/11/2015  ? 10:14 AM  ?PHQ 2/9 Scores  ?PHQ - 2 Score 0 0 0 0 0 0 0  ?PHQ- 9 Score   0 0  0   ?  ?Fall Risk ? ?  12/28/2020  ?  3:02 PM 12/01/2020  ? 11:26 AM 12/01/2020  ? 11:22 AM 12/31/2019  ? 10:18 AM 12/28/2019  ?  2:40 PM  ?Fall Risk   ?Falls in the past year? 1 1 0 0 0  ?Number falls in past yr: 0 0 0  0  ?Injury with Fall? 1 1 0  0  ?Risk for fall due to : Impaired balance/gait;History of fall(s) History of fall(s);Impaired balance/gait   Impaired balance/gait  ?Follow up Falls prevention discussed Falls evaluation completed Falls evaluation completed Falls evaluation completed Falls prevention discussed  ? ? ?FALL RISK PREVENTION PERTAINING TO THE HOME: ? ?Any stairs in or around the home? No  ?If so, are there any without handrails? No  ?Home free of loose throw rugs in walkways, pet beds, electrical cords, etc? Yes  ?Adequate lighting in your home to reduce risk of falls? Yes  ? ?ASSISTIVE DEVICES UTILIZED TO PREVENT FALLS: ? ?Life alert? Yes  ?Use of a cane, walker or w/c? Yes  ?Grab bars in the bathroom? No  ?Shower chair or bench in  shower? No  ?Elevated toilet seat or a handicapped toilet? Yes  ? ?TIMED UP AND GO: ? ?Was the test performed? No . Telephonic visit.  ? ?Cognitive Function: Normal cognitive status assessed by direct obser

## 2022-01-01 NOTE — Patient Instructions (Signed)
Ms. Kitchin , ?Thank you for taking time to come for your Medicare Wellness Visit. I appreciate your ongoing commitment to your health goals. Please review the following plan we discussed and let me know if I can assist you in the future.  ? ?Screening recommendations/referrals: ?Colonoscopy: no  longer required ?Mammogram: no longer required ?Bone Density: no longer required ?Recommended yearly ophthalmology/optometry visit for glaucoma screening and checkup ?Recommended yearly dental visit for hygiene and checkup ? ?Vaccinations: ?Influenza vaccine: due fall 2023 ?Pneumococcal vaccine: due ?Tdap vaccine: due ?Shingles vaccine: done 08/26/19 & 11/12/19 ?Covid-19:done 10/02/19 & 10/23/19 ? ?Advanced directives: Please bring a copy of your health care power of attorney and living will to the office at your convenience.  ? ?Conditions/risks identified: Keep up the great work! ? ?Next appointment: Follow up in one year for your annual wellness visit  ? ? ?Preventive Care 15 Years and Older, Female ?Preventive care refers to lifestyle choices and visits with your health care provider that can promote health and wellness. ?What does preventive care include? ?A yearly physical exam. This is also called an annual well check. ?Dental exams once or twice a year. ?Routine eye exams. Ask your health care provider how often you should have your eyes checked. ?Personal lifestyle choices, including: ?Daily care of your teeth and gums. ?Regular physical activity. ?Eating a healthy diet. ?Avoiding tobacco and drug use. ?Limiting alcohol use. ?Practicing safe sex. ?Taking low-dose aspirin every day. ?Taking vitamin and mineral supplements as recommended by your health care provider. ?What happens during an annual well check? ?The services and screenings done by your health care provider during your annual well check will depend on your age, overall health, lifestyle risk factors, and family history of disease. ?Counseling  ?Your health  care provider may ask you questions about your: ?Alcohol use. ?Tobacco use. ?Drug use. ?Emotional well-being. ?Home and relationship well-being. ?Sexual activity. ?Eating habits. ?History of falls. ?Memory and ability to understand (cognition). ?Work and work Statistician. ?Reproductive health. ?Screening  ?You may have the following tests or measurements: ?Height, weight, and BMI. ?Blood pressure. ?Lipid and cholesterol levels. These may be checked every 5 years, or more frequently if you are over 50 years old. ?Skin check. ?Lung cancer screening. You may have this screening every year starting at age 38 if you have a 30-pack-year history of smoking and currently smoke or have quit within the past 15 years. ?Fecal occult blood test (FOBT) of the stool. You may have this test every year starting at age 78. ?Flexible sigmoidoscopy or colonoscopy. You may have a sigmoidoscopy every 5 years or a colonoscopy every 10 years starting at age 7. ?Hepatitis C blood test. ?Hepatitis B blood test. ?Sexually transmitted disease (STD) testing. ?Diabetes screening. This is done by checking your blood sugar (glucose) after you have not eaten for a while (fasting). You may have this done every 1-3 years. ?Bone density scan. This is done to screen for osteoporosis. You may have this done starting at age 53. ?Mammogram. This may be done every 1-2 years. Talk to your health care provider about how often you should have regular mammograms. ?Talk with your health care provider about your test results, treatment options, and if necessary, the need for more tests. ?Vaccines  ?Your health care provider may recommend certain vaccines, such as: ?Influenza vaccine. This is recommended every year. ?Tetanus, diphtheria, and acellular pertussis (Tdap, Td) vaccine. You may need a Td booster every 10 years. ?Zoster vaccine. You may need this after  age 66. ?Pneumococcal 13-valent conjugate (PCV13) vaccine. One dose is recommended after age  73. ?Pneumococcal polysaccharide (PPSV23) vaccine. One dose is recommended after age 79. ?Talk to your health care provider about which screenings and vaccines you need and how often you need them. ?This information is not intended to replace advice given to you by your health care provider. Make sure you discuss any questions you have with your health care provider. ?Document Released: 09/16/2015 Document Revised: 05/09/2016 Document Reviewed: 06/21/2015 ?Elsevier Interactive Patient Education ? 2017 [place]. ? ?Fall Prevention in the Home ?Falls can cause injuries. They can happen to people of all ages. There are many things you can do to make your home safe and to help prevent falls. ?What can I do on the outside of my home? ?Regularly fix the edges of walkways and driveways and fix any cracks. ?Remove anything that might make you trip as you walk through a door, such as a raised step or threshold. ?Trim any bushes or trees on the path to your home. ?Use bright outdoor lighting. ?Clear any walking paths of anything that might make someone trip, such as rocks or tools. ?Regularly check to see if handrails are loose or broken. Make sure that both sides of any steps have handrails. ?Any raised decks and porches should have guardrails on the edges. ?Have any leaves, snow, or ice cleared regularly. ?Use sand or salt on walking paths during winter. ?Clean up any spills in your garage right away. This includes oil or grease spills. ?What can I do in the bathroom? ?Use night lights. ?Install grab bars by the toilet and in the tub and shower. Do not use towel bars as grab bars. ?Use non-skid mats or decals in the tub or shower. ?If you need to sit down in the shower, use a plastic, non-slip stool. ?Keep the floor dry. Clean up any water that spills on the floor as soon as it happens. ?Remove soap buildup in the tub or shower regularly. ?Attach bath mats securely with double-sided non-slip rug tape. ?Do not have throw  rugs and other things on the floor that can make you trip. ?What can I do in the bedroom? ?Use night lights. ?Make sure that you have a light by your bed that is easy to reach. ?Do not use any sheets or blankets that are too big for your bed. They should not hang down onto the floor. ?Have a firm chair that has side arms. You can use this for support while you get dressed. ?Do not have throw rugs and other things on the floor that can make you trip. ?What can I do in the kitchen? ?Clean up any spills right away. ?Avoid walking on wet floors. ?Keep items that you use a lot in easy-to-reach places. ?If you need to reach something above you, use a strong step stool that has a grab bar. ?Keep electrical cords out of the way. ?Do not use floor polish or wax that makes floors slippery. If you must use wax, use non-skid floor wax. ?Do not have throw rugs and other things on the floor that can make you trip. ?What can I do with my stairs? ?Do not leave any items on the stairs. ?Make sure that there are handrails on both sides of the stairs and use them. Fix handrails that are broken or loose. Make sure that handrails are as long as the stairways. ?Check any carpeting to make sure that it is firmly attached to the stairs.  Fix any carpet that is loose or worn. ?Avoid having throw rugs at the top or bottom of the stairs. If you do have throw rugs, attach them to the floor with carpet tape. ?Make sure that you have a light switch at the top of the stairs and the bottom of the stairs. If you do not have them, ask someone to add them for you. ?What else can I do to help prevent falls? ?Wear shoes that: ?Do not have high heels. ?Have rubber bottoms. ?Are comfortable and fit you well. ?Are closed at the toe. Do not wear sandals. ?If you use a stepladder: ?Make sure that it is fully opened. Do not climb a closed stepladder. ?Make sure that both sides of the stepladder are locked into place. ?Ask someone to hold it for you, if  possible. ?Clearly mark and make sure that you can see: ?Any grab bars or handrails. ?First and last steps. ?Where the edge of each step is. ?Use tools that help you move around (mobility aids) if they are needed

## 2022-01-23 DIAGNOSIS — I442 Atrioventricular block, complete: Secondary | ICD-10-CM | POA: Diagnosis not present

## 2022-01-23 DIAGNOSIS — I10 Essential (primary) hypertension: Secondary | ICD-10-CM | POA: Diagnosis not present

## 2022-01-23 DIAGNOSIS — Z950 Presence of cardiac pacemaker: Secondary | ICD-10-CM | POA: Diagnosis not present

## 2022-04-02 DIAGNOSIS — N952 Postmenopausal atrophic vaginitis: Secondary | ICD-10-CM | POA: Diagnosis not present

## 2022-04-02 DIAGNOSIS — Z4689 Encounter for fitting and adjustment of other specified devices: Secondary | ICD-10-CM | POA: Diagnosis not present

## 2022-04-02 DIAGNOSIS — N814 Uterovaginal prolapse, unspecified: Secondary | ICD-10-CM | POA: Diagnosis not present

## 2022-04-10 DIAGNOSIS — I442 Atrioventricular block, complete: Secondary | ICD-10-CM | POA: Diagnosis not present

## 2022-05-22 DIAGNOSIS — I10 Essential (primary) hypertension: Secondary | ICD-10-CM | POA: Diagnosis not present

## 2022-05-22 DIAGNOSIS — Z950 Presence of cardiac pacemaker: Secondary | ICD-10-CM | POA: Diagnosis not present

## 2022-05-22 DIAGNOSIS — I442 Atrioventricular block, complete: Secondary | ICD-10-CM | POA: Diagnosis not present

## 2022-06-27 DIAGNOSIS — N3941 Urge incontinence: Secondary | ICD-10-CM | POA: Diagnosis not present

## 2022-06-27 DIAGNOSIS — N952 Postmenopausal atrophic vaginitis: Secondary | ICD-10-CM | POA: Diagnosis not present

## 2022-06-27 DIAGNOSIS — N814 Uterovaginal prolapse, unspecified: Secondary | ICD-10-CM | POA: Diagnosis not present

## 2022-06-27 DIAGNOSIS — Z4689 Encounter for fitting and adjustment of other specified devices: Secondary | ICD-10-CM | POA: Diagnosis not present

## 2022-09-13 ENCOUNTER — Telehealth: Payer: Self-pay | Admitting: Family Medicine

## 2022-09-13 NOTE — Telephone Encounter (Signed)
Copied from [place] [id number]. Topic: General - Other >> Sep 13, 2022 12:12 PM Tiffany B wrote: Patient son called requesting a call back from PCP regarding a personal matter.   Caller did not want to elaborate but stated Mrs. Rebello would like to offer something to PCP.   If PCP calls back prior to [place] the son maybe available.   Caller did stat okay to speak with Mrs. Borgwardt.

## 2022-09-18 DIAGNOSIS — I442 Atrioventricular block, complete: Secondary | ICD-10-CM | POA: Diagnosis not present

## 2022-09-25 DIAGNOSIS — Z4689 Encounter for fitting and adjustment of other specified devices: Secondary | ICD-10-CM | POA: Diagnosis not present

## 2022-09-25 DIAGNOSIS — N952 Postmenopausal atrophic vaginitis: Secondary | ICD-10-CM | POA: Diagnosis not present

## 2022-09-25 DIAGNOSIS — N814 Uterovaginal prolapse, unspecified: Secondary | ICD-10-CM | POA: Diagnosis not present

## 2022-10-05 DIAGNOSIS — Z950 Presence of cardiac pacemaker: Secondary | ICD-10-CM | POA: Diagnosis not present

## 2022-10-05 DIAGNOSIS — I10 Essential (primary) hypertension: Secondary | ICD-10-CM | POA: Diagnosis not present

## 2022-10-05 DIAGNOSIS — I442 Atrioventricular block, complete: Secondary | ICD-10-CM | POA: Diagnosis not present

## 2022-10-05 DIAGNOSIS — Z23 Encounter for immunization: Secondary | ICD-10-CM | POA: Diagnosis not present

## 2022-11-25 DIAGNOSIS — Z960 Presence of urogenital implants: Secondary | ICD-10-CM | POA: Diagnosis not present

## 2022-11-25 DIAGNOSIS — N3000 Acute cystitis without hematuria: Secondary | ICD-10-CM | POA: Diagnosis not present

## 2022-11-25 DIAGNOSIS — R34 Anuria and oliguria: Secondary | ICD-10-CM | POA: Diagnosis not present

## 2022-11-25 DIAGNOSIS — N949 Unspecified condition associated with female genital organs and menstrual cycle: Secondary | ICD-10-CM | POA: Diagnosis not present

## 2022-11-27 DIAGNOSIS — N814 Uterovaginal prolapse, unspecified: Secondary | ICD-10-CM | POA: Diagnosis not present

## 2022-11-27 DIAGNOSIS — N952 Postmenopausal atrophic vaginitis: Secondary | ICD-10-CM | POA: Diagnosis not present

## 2022-11-27 DIAGNOSIS — Z4689 Encounter for fitting and adjustment of other specified devices: Secondary | ICD-10-CM | POA: Diagnosis not present

## 2022-11-27 DIAGNOSIS — R399 Unspecified symptoms and signs involving the genitourinary system: Secondary | ICD-10-CM | POA: Diagnosis not present

## 2023-02-04 DIAGNOSIS — N952 Postmenopausal atrophic vaginitis: Secondary | ICD-10-CM | POA: Diagnosis not present

## 2023-02-04 DIAGNOSIS — N814 Uterovaginal prolapse, unspecified: Secondary | ICD-10-CM | POA: Diagnosis not present

## 2023-02-04 DIAGNOSIS — R399 Unspecified symptoms and signs involving the genitourinary system: Secondary | ICD-10-CM | POA: Diagnosis not present

## 2023-02-04 DIAGNOSIS — Z4689 Encounter for fitting and adjustment of other specified devices: Secondary | ICD-10-CM | POA: Diagnosis not present

## 2023-02-26 DIAGNOSIS — N952 Postmenopausal atrophic vaginitis: Secondary | ICD-10-CM | POA: Diagnosis not present

## 2023-02-26 DIAGNOSIS — N814 Uterovaginal prolapse, unspecified: Secondary | ICD-10-CM | POA: Diagnosis not present

## 2023-02-26 DIAGNOSIS — Z4689 Encounter for fitting and adjustment of other specified devices: Secondary | ICD-10-CM | POA: Diagnosis not present

## 2023-03-20 DIAGNOSIS — K5900 Constipation, unspecified: Secondary | ICD-10-CM | POA: Diagnosis not present

## 2023-03-20 DIAGNOSIS — I10 Essential (primary) hypertension: Secondary | ICD-10-CM | POA: Diagnosis not present

## 2023-03-20 DIAGNOSIS — I872 Venous insufficiency (chronic) (peripheral): Secondary | ICD-10-CM | POA: Diagnosis not present

## 2023-03-20 DIAGNOSIS — R32 Unspecified urinary incontinence: Secondary | ICD-10-CM | POA: Diagnosis not present

## 2023-03-20 DIAGNOSIS — M1990 Unspecified osteoarthritis, unspecified site: Secondary | ICD-10-CM | POA: Diagnosis not present

## 2023-04-02 DIAGNOSIS — Z4689 Encounter for fitting and adjustment of other specified devices: Secondary | ICD-10-CM | POA: Diagnosis not present

## 2023-04-02 DIAGNOSIS — N814 Uterovaginal prolapse, unspecified: Secondary | ICD-10-CM | POA: Diagnosis not present

## 2023-04-03 DIAGNOSIS — Z950 Presence of cardiac pacemaker: Secondary | ICD-10-CM | POA: Diagnosis not present

## 2023-04-03 DIAGNOSIS — I442 Atrioventricular block, complete: Secondary | ICD-10-CM | POA: Diagnosis not present

## 2023-04-05 DIAGNOSIS — Z950 Presence of cardiac pacemaker: Secondary | ICD-10-CM | POA: Diagnosis not present

## 2023-04-05 DIAGNOSIS — I442 Atrioventricular block, complete: Secondary | ICD-10-CM | POA: Diagnosis not present

## 2023-04-05 DIAGNOSIS — I10 Essential (primary) hypertension: Secondary | ICD-10-CM | POA: Diagnosis not present

## 2023-04-23 DIAGNOSIS — H43813 Vitreous degeneration, bilateral: Secondary | ICD-10-CM | POA: Diagnosis not present

## 2023-04-23 DIAGNOSIS — H353131 Nonexudative age-related macular degeneration, bilateral, early dry stage: Secondary | ICD-10-CM | POA: Diagnosis not present

## 2023-04-23 DIAGNOSIS — H2513 Age-related nuclear cataract, bilateral: Secondary | ICD-10-CM | POA: Diagnosis not present

## 2023-04-23 DIAGNOSIS — Z0100 Encounter for examination of eyes and vision without abnormal findings: Secondary | ICD-10-CM | POA: Diagnosis not present

## 2023-04-30 DIAGNOSIS — N814 Uterovaginal prolapse, unspecified: Secondary | ICD-10-CM | POA: Diagnosis not present

## 2023-04-30 DIAGNOSIS — Z4689 Encounter for fitting and adjustment of other specified devices: Secondary | ICD-10-CM | POA: Diagnosis not present

## 2023-05-13 ENCOUNTER — Ambulatory Visit: Payer: Self-pay

## 2023-05-13 ENCOUNTER — Telehealth: Payer: Self-pay

## 2023-05-13 NOTE — Telephone Encounter (Signed)
Spoke with patient and her son. Patient is feeling okay today. Taking otc mucinex and sudafed and this has helped. Tested positive for Covid this morning. Has cough, runny nose. No shortness of breath or fever.  Oxygen over the phone on home SpO2 was 96% at this time. Patient said she did not need to keep appointment for tomorrow. Canceled appointment but I did tell son to continue to monitor patient. If her oxygen drops, or if she gets worse told him to take patient to ER.  He verbalized understanding.  - Makelle Marrone

## 2023-05-13 NOTE — Telephone Encounter (Signed)
Summary: runny nose/cough   Patient's son called and stated that patient has had a runny nose and cough for 1 day. No appointments available with pcp. Patient declined appointment with another provider.     Chief Complaint: Productive cough, runny nose. COVID positive Symptoms: Above Frequency: Yesterday Pertinent Negatives: Patient denies fever, no SOB Disposition: [] ED /[] Urgent Care (no appt availability in office) / [x] Appointment(In office/virtual)/ []  [facility]/ [] Home Care/ [] Refused Recommended Disposition /[] [place] [facility]/ []  Follow-up with PCP Additional Notes: Pt. Agrees with appointment. Unable to do virtual visit.  Reason for Disposition  [1] Continuous (nonstop) coughing interferes with work or school AND [2] no improvement using cough treatment per Care Advice  Answer Assessment - Initial Assessment Questions 1. ONSET: "When did the cough begin?"      Yesterday 2. SEVERITY: "How bad is the cough today?"      Moderate 3. SPUTUM: "Describe the color of your sputum" (none, dry cough; clear, white, yellow, green)     White 4. HEMOPTYSIS: "Are you coughing up any blood?" If so ask: "How much?" (flecks, streaks, tablespoons, etc.)     No 5. DIFFICULTY BREATHING: "Are you having difficulty breathing?" If Yes, ask: "How bad is it?" (e.g., mild, moderate, severe)    - MILD: No SOB at rest, mild SOB with walking, speaks normally in sentences, can lie down, no retractions, pulse < 100.    - MODERATE: SOB at rest, SOB with minimal exertion and prefers to sit, cannot lie down flat, speaks in phrases, mild retractions, audible wheezing, pulse 100-120.    - SEVERE: Very SOB at rest, speaks in single words, struggling to breathe, sitting hunched forward, retractions, pulse > 120      No 6. FEVER: "Do you have a fever?" If Yes, ask: "What is your temperature, how was it measured, and when did it start?"     No 7. CARDIAC HISTORY: "Do you have any history of heart  disease?" (e.g., heart attack, congestive heart failure)      Pacemaker 8. LUNG HISTORY: "Do you have any history of lung disease?"  (e.g., pulmonary embolus, asthma, emphysema)     No 9. PE RISK FACTORS: "Do you have a history of blood clots?" (or: recent major surgery, recent prolonged travel, bedridden)     No 10. OTHER SYMPTOMS: "Do you have any other symptoms?" (e.g., runny nose, wheezing, chest pain)       Runny nose 11. PREGNANCY: "Is there any chance you are pregnant?" "When was your last menstrual period?"       No 12. TRAVEL: "Have you traveled out of the country in the last month?" (e.g., travel history, exposures)       No  Protocols used: Cough - Acute Productive-A-AH

## 2023-05-14 ENCOUNTER — Ambulatory Visit: Payer: Medicare Other | Admitting: Internal Medicine

## 2023-05-14 ENCOUNTER — Encounter: Payer: Self-pay | Admitting: Internal Medicine

## 2023-05-14 ENCOUNTER — Ambulatory Visit (INDEPENDENT_AMBULATORY_CARE_PROVIDER_SITE_OTHER): Payer: Medicare Other | Admitting: Internal Medicine

## 2023-05-14 VITALS — BP 124/78 | HR 83 | Temp 98.6°F | Ht 62.0 in | Wt 142.0 lb

## 2023-05-14 DIAGNOSIS — U071 COVID-19: Secondary | ICD-10-CM | POA: Diagnosis not present

## 2023-05-14 DIAGNOSIS — R051 Acute cough: Secondary | ICD-10-CM | POA: Diagnosis not present

## 2023-05-14 MED ORDER — PROMETHAZINE-DM 6.25-15 MG/5ML PO SYRP: 5.0000 mL | ORAL_SOLUTION | Freq: Every evening | ORAL | 0 refills | Status: AC | PRN

## 2023-05-14 NOTE — Progress Notes (Signed)
Date:  05/14/2023   Name:  Teresa Mays   DOB:  [birth year]   MRN:  [id number]   Chief Complaint: Covid Positive (Patient tested positive Yesterday. Her son tested Positive Sunday morning. Patient has sxs of cough- clear production, fatigued and weak.)  Cough This is a new problem. The current episode started yesterday. The problem occurs every few minutes. The cough is Non-productive (tested positive for Covid yesterday). Pertinent negatives include no chest pain, chills, fever, headaches, shortness of breath or wheezing.    Lab Results  Component Value Date   NA 136 01/07/2021   K 4.0 01/07/2021   CO2 28 01/07/2021   GLUCOSE 166 (H) 01/07/2021   BUN 23 01/07/2021   CREATININE 1.20 (H) 01/07/2021   CALCIUM 9.4 01/07/2021   GFRNONAA 43 (L) 01/07/2021   No results found for: "CHOL", "HDL", "LDLCALC", "LDLDIRECT", "TRIG", "CHOLHDL" No results found for: "TSH" No results found for: "HGBA1C" Lab Results  Component Value Date   WBC 4.3 01/07/2021   HGB 14.2 01/07/2021   HCT 43.1 01/07/2021   MCV 94.1 01/07/2021   PLT 123 (L) 01/07/2021   Lab Results  Component Value Date   ALT 18 08/30/2020   AST 33 08/30/2020   ALKPHOS 75 08/30/2020   BILITOT 1.3 (H) 08/30/2020   No results found for: "25OHVITD2", "25OHVITD3", "VD25OH"   Review of Systems  Constitutional:  Positive for fatigue. Negative for appetite change, chills and fever.  HENT:  Negative for trouble swallowing.   Eyes:  Negative for visual disturbance.  Respiratory:  Positive for cough. Negative for chest tightness, shortness of breath and wheezing.   Cardiovascular:  Negative for chest pain.  Gastrointestinal:  Negative for abdominal distention, diarrhea and nausea.  Neurological:  Positive for weakness. Negative for dizziness and headaches.  Psychiatric/Behavioral:  Negative for dysphoric mood and sleep disturbance. The patient is not nervous/anxious.     Patient Active Problem List   Diagnosis Date  Noted   Complete heart block (HCC) 08/10/2020   Mastalgia 04/25/2016   Bladder cystocele 11/11/2015   BP (high blood pressure) 01/27/2014   Encounter for checking and testing of cardiac pacemaker pulse generator (battery) 01/27/2014    Allergies  Allergen Reactions   Epinephrine Anaphylaxis and Other (See Comments)    Pass out   Sulfa Antibiotics Anaphylaxis and Shortness Of Breath   Mirabegron     Other reaction(s): Dizziness Constipation, headache, high-blood pressure, exhaustion, blurred vision, GI upset.   Pregabalin Other (See Comments)    BREATHING     Past Surgical History:  Procedure Laterality Date   ABDOMINAL HYSTERECTOMY     40  years ago   BREAST BIOPSY Left 1987   neg   BREAST SURGERY  1990   Fibroadenom right    COLONOSCOPY  2005   ORIF WRIST FRACTURE Left 09/01/2020   Procedure: OPEN REDUCTION INTERNAL FIXATION (ORIF) WRIST FRACTURE;  Surgeon: Kennedy Bucker, MD;  Location: [facility];  Service: Orthopedics;  Laterality: Left;   PACEMAKER INSERTION  [id number]   PACEMAKER LEADLESS INSERTION N/A 08/10/2020   Procedure: PACEMAKER LEADLESS INSERTION;  Surgeon: Marcina Millard, MD;  Location: [facility];  Service: Cardiovascular;  Laterality: N/A;   PPM GENERATOR REMOVAL N/A 08/10/2020   Procedure: PPM GENERATOR REMOVAL;  Surgeon: Marcina Millard, MD;  Location: [facility];  Service: Cardiovascular;  Laterality: N/A;    Social History   Tobacco Use   Smoking status: Never   Smokeless tobacco: Never  Vaping  Use   Vaping status: Never Used  Substance Use Topics   Alcohol use: No    Alcohol/week: 0.0 standard drinks of alcohol   Drug use: No     Medication list has been reviewed and updated.  Current Meds  Medication Sig   Docusate Calcium (STOOL SOFTENER PO) Take 100 mg by mouth at bedtime.   NORVASC 2.5 MG tablet Take 2.5 tablets by mouth at bedtime. Dr Darrold Junker   OXYQUINOLONE SULFATE VAGINAL (TRIMO-SAN) 0.025 % GEL Place 1  application vaginally 3 (three) times a week.    promethazine-dextromethorphan (PROMETHAZINE-DM) 6.25-15 MG/5ML syrup Take 5 mLs by mouth at bedtime as needed for up to 24 days for cough.   Current Facility-Administered Medications for the 05/14/23 encounter (Office Visit) with Reubin Milan, MD  Medication   albuterol (PROVENTIL) (2.5 MG/3ML) 0.083% nebulizer solution 2.5 mg       05/14/2023   11:34 AM 12/01/2020   11:22 AM 12/31/2019   10:18 AM  GAD 7 : Generalized Anxiety Score  Nervous, Anxious, on Edge 0 0 0  Control/stop worrying 0 0 0  Worry too much - different things 0 0 0  Trouble relaxing 0 0 0  Restless 0 0 0  Easily annoyed or irritable 0 0 0  Afraid - awful might happen 0 0 0  Total GAD 7 Score 0 0 0  Anxiety Difficulty Not difficult at all Not difficult at all        05/14/2023   11:34 AM 01/01/2022    3:13 PM 12/28/2020    2:58 PM  Depression screen PHQ 2/9  Decreased Interest 0 0 0  Down, Depressed, Hopeless 0 0 0  PHQ - 2 Score 0 0 0  Altered sleeping 0    Tired, decreased energy 0    Change in appetite 0    Feeling bad or failure about yourself  0    Trouble concentrating 0    Moving slowly or fidgety/restless 0    Suicidal thoughts 0    PHQ-9 Score 0    Difficult doing work/chores Not difficult at all      BP Readings from Last 3 Encounters:  05/14/23 124/78  01/07/21 (!) 141/65  12/01/20 (!) 142/88    Physical Exam Constitutional:      Appearance: Normal appearance.  HENT:     Nose:     Right Sinus: No maxillary sinus tenderness.     Left Sinus: No maxillary sinus tenderness.  Cardiovascular:     Rate and Rhythm: Normal rate and regular rhythm.  Pulmonary:     Effort: Pulmonary effort is normal.     Breath sounds: No wheezing or rhonchi.  Abdominal:     Palpations: Abdomen is soft.     Tenderness: There is no abdominal tenderness.  Musculoskeletal:     Cervical back: Normal range of motion.     Right lower leg: No edema.     Left  lower leg: No edema.  Lymphadenopathy:     Cervical: No cervical adenopathy.  Neurological:     Mental Status: She is alert and oriented to person, place, and time.  Psychiatric:        Attention and Perception: Attention normal.        Mood and Affect: Mood normal.     Wt Readings from Last 3 Encounters:  05/14/23 142 lb (64.4 kg)  01/07/21 142 lb (64.4 kg)  12/01/20 148 lb (67.1 kg)    BP 124/78  Pulse 83   Temp 98.6 F (37 C) (Oral)   Ht 5\' 2"  (1.575 m)   Wt 142 lb (64.4 kg)   SpO2 96%   BMI 25.97 kg/m   Assessment and Plan:  Problem List Items Addressed This Visit   None Visit Diagnoses     COVID-19 virus infection    -  Primary   on day three of mild covid symptoms continue supportive care with Tylenol, fluids, Mucinex will add cough syrup; no antivirals due to mild sym   Acute cough       Relevant Medications   promethazine-dextromethorphan (PROMETHAZINE-DM) 6.25-15 MG/5ML syrup       No follow-ups on file.    Reubin Milan, MD [facility] [place]

## 2023-07-23 DIAGNOSIS — N952 Postmenopausal atrophic vaginitis: Secondary | ICD-10-CM | POA: Diagnosis not present

## 2023-07-23 DIAGNOSIS — N814 Uterovaginal prolapse, unspecified: Secondary | ICD-10-CM | POA: Diagnosis not present

## 2023-07-23 DIAGNOSIS — Z4689 Encounter for fitting and adjustment of other specified devices: Secondary | ICD-10-CM | POA: Diagnosis not present

## 2023-12-17 ENCOUNTER — Ambulatory Visit (INDEPENDENT_AMBULATORY_CARE_PROVIDER_SITE_OTHER): Admitting: Family Medicine

## 2023-12-17 ENCOUNTER — Encounter: Payer: Self-pay | Admitting: Family Medicine

## 2023-12-17 VITALS — BP 123/82 | HR 78 | Resp 16 | Ht 62.0 in | Wt 133.4 lb

## 2023-12-17 DIAGNOSIS — J301 Allergic rhinitis due to pollen: Secondary | ICD-10-CM | POA: Diagnosis not present

## 2023-12-17 DIAGNOSIS — D696 Thrombocytopenia, unspecified: Secondary | ICD-10-CM | POA: Diagnosis not present

## 2023-12-17 DIAGNOSIS — R739 Hyperglycemia, unspecified: Secondary | ICD-10-CM | POA: Diagnosis not present

## 2023-12-17 DIAGNOSIS — R944 Abnormal results of kidney function studies: Secondary | ICD-10-CM

## 2023-12-17 MED ORDER — TRIAMCINOLONE ACETONIDE 55 MCG/ACT NA AERO: 2.0000 | INHALATION_SPRAY | Freq: Every day | NASAL | 12 refills | Status: DC

## 2023-12-17 NOTE — Progress Notes (Signed)
 Date:  12/17/2023   Name:  Teresa Mays   DOB:  [birth year]   MRN:  [id number]   Chief Complaint: Sinus Problem  Sinus Problem This is a chronic problem. The current episode started in the past 7 days. The problem has been gradually improving since onset. There has been no fever. The pain is mild. Associated symptoms include congestion. Pertinent negatives include no chills, coughing, diaphoresis, ear pain, headaches, hoarse voice, neck pain, shortness of breath, sinus pressure, sneezing, sore throat or swollen glands. Treatments tried: antihistamine/zyrtec. The treatment provided mild relief.    Lab Results  Component Value Date   NA 136 01/07/2021   K 4.0 01/07/2021   CO2 28 01/07/2021   GLUCOSE 166 (H) 01/07/2021   BUN 23 01/07/2021   CREATININE 1.20 (H) 01/07/2021   CALCIUM 9.4 01/07/2021   GFRNONAA 43 (L) 01/07/2021   No results found for: "CHOL", "HDL", "LDLCALC", "LDLDIRECT", "TRIG", "CHOLHDL" No results found for: "TSH" No results found for: "HGBA1C" Lab Results  Component Value Date   WBC 4.3 01/07/2021   HGB 14.2 01/07/2021   HCT 43.1 01/07/2021   MCV 94.1 01/07/2021   PLT 123 (L) 01/07/2021   Lab Results  Component Value Date   ALT 18 08/30/2020   AST 33 08/30/2020   ALKPHOS 75 08/30/2020   BILITOT 1.3 (H) 08/30/2020   No results found for: "25OHVITD2", "25OHVITD3", "VD25OH"   Review of Systems  Constitutional:  Negative for chills and diaphoresis.  HENT:  Positive for congestion. Negative for ear pain, hoarse voice, nosebleeds, rhinorrhea, sinus pressure, sneezing and sore throat.   Respiratory:  Negative for cough, chest tightness, shortness of breath and wheezing.   Cardiovascular:  Negative for chest pain, palpitations and leg swelling.  Gastrointestinal:  Negative for abdominal distention.  Musculoskeletal:  Negative for neck pain.  Neurological:  Negative for headaches.    Patient Active Problem List   Diagnosis Date Noted   Complete  heart block (HCC) 08/10/2020   Mastalgia 04/25/2016   Bladder cystocele 11/11/2015   BP (high blood pressure) 01/27/2014   Encounter for checking and testing of cardiac pacemaker pulse generator (battery) 01/27/2014    Allergies  Allergen Reactions   Epinephrine Anaphylaxis and Other (See Comments)    Pass out   Sulfa Antibiotics Anaphylaxis and Shortness Of Breath   Mirabegron     Other reaction(s): Dizziness Constipation, headache, high-blood pressure, exhaustion, blurred vision, GI upset.   Pregabalin Other (See Comments)    BREATHING     Past Surgical History:  Procedure Laterality Date   ABDOMINAL HYSTERECTOMY     40 years ago   BREAST BIOPSY Left 1987   neg   BREAST SURGERY  1990   Fibroadenom right    COLONOSCOPY  2005   ORIF WRIST FRACTURE Left 09/01/2020   Procedure: OPEN REDUCTION INTERNAL FIXATION (ORIF) WRIST FRACTURE;  Surgeon: Kennedy Bucker, MD;  Location: [facility];  Service: Orthopedics;  Laterality: Left;   PACEMAKER INSERTION  [id number]   PACEMAKER LEADLESS INSERTION N/A 08/10/2020   Procedure: PACEMAKER LEADLESS INSERTION;  Surgeon: Marcina Millard, MD;  Location: [facility];  Service: Cardiovascular;  Laterality: N/A;   PPM GENERATOR REMOVAL N/A 08/10/2020   Procedure: PPM GENERATOR REMOVAL;  Surgeon: Marcina Millard, MD;  Location: [facility];  Service: Cardiovascular;  Laterality: N/A;    Social History   Tobacco Use   Smoking status: Never   Smokeless tobacco: Never  Vaping Use   Vaping  status: Never Used  Substance Use Topics   Alcohol use: No    Alcohol/week: 0.0 standard drinks of alcohol   Drug use: No     Medication list has been reviewed and updated.  Current Meds  Medication Sig   Docusate Calcium (STOOL SOFTENER PO) Take 100 mg by mouth at bedtime.   Magnesium 250 MG CAPS Take by mouth.   NORVASC 2.5 MG tablet Take 2.5 tablets by mouth at bedtime. Dr Darrold Junker   Current Facility-Administered  Medications for the 12/17/23 encounter (Office Visit) with Duanne Limerick, MD  Medication   albuterol (PROVENTIL) (2.5 MG/3ML) 0.083% nebulizer solution 2.5 mg       05/14/2023   11:34 AM 12/01/2020   11:22 AM 12/31/2019   10:18 AM  GAD 7 : Generalized Anxiety Score  Nervous, Anxious, on Edge 0 0 0  Control/stop worrying 0 0 0  Worry too much - different things 0 0 0  Trouble relaxing 0 0 0  Restless 0 0 0  Easily annoyed or irritable 0 0 0  Afraid - awful might happen 0 0 0  Total GAD 7 Score 0 0 0  Anxiety Difficulty Not difficult at all Not difficult at all        05/14/2023   11:34 AM 01/01/2022    3:13 PM 12/28/2020    2:58 PM  Depression screen PHQ 2/9  Decreased Interest 0 0 0  Down, Depressed, Hopeless 0 0 0  PHQ - 2 Score 0 0 0  Altered sleeping 0    Tired, decreased energy 0    Change in appetite 0    Feeling bad or failure about yourself  0    Trouble concentrating 0    Moving slowly or fidgety/restless 0    Suicidal thoughts 0    PHQ-9 Score 0    Difficult doing work/chores Not difficult at all      BP Readings from Last 3 Encounters:  12/17/23 123/82  05/14/23 124/78  01/07/21 (!) 141/65    Physical Exam Vitals and nursing note reviewed.  HENT:     Head: Normocephalic.     Right Ear: Hearing, tympanic membrane, ear canal and external ear normal. There is no impacted cerumen.     Left Ear: Hearing, tympanic membrane, ear canal and external ear normal. There is no impacted cerumen.     Nose: Nose normal. No congestion or rhinorrhea.     Mouth/Throat:     Mouth: Mucous membranes are moist.  Cardiovascular:     Rate and Rhythm: Normal rate and regular rhythm.     Heart sounds: Murmur heard.     Systolic murmur is present with a grade of 1/6.     No diastolic murmur is present.     No friction rub. No gallop. No S3 or S4 sounds.  Pulmonary:     Breath sounds: No wheezing, rhonchi or rales.  Musculoskeletal:     Right lower leg: No edema.     Left  lower leg: No edema.  Neurological:     Mental Status: She is alert.     Wt Readings from Last 3 Encounters:  12/17/23 133 lb 6.4 oz (60.5 kg)  05/14/23 142 lb (64.4 kg)  01/07/21 142 lb (64.4 kg)    BP 123/82   Pulse 78   Resp 16   Ht 5\' 2"  (1.575 m)   Wt 133 lb 6.4 oz (60.5 kg)   SpO2 99%   BMI 24.40 kg/m  Assessment and Plan: 1. Seasonal allergic rhinitis due to pollen (Primary) New onset.  Persistent.  Stable.  Symptoms and exam is consistent with seasonal rhinitis and we will treat with Nasacort steroid 2 puffs in nostril daily. - triamcinolone (NASACORT) 55 MCG/ACT AERO nasal inhaler; Place 2 sprays into the nose daily.  Dispense: 1 each; Refill: 12  2. Thrombocytopenia (HCC) Mild decrease in platelet count over the past couple readings of CBC we will follow-up with CBC today - CBC with Differential/Platelet  3. Decreased GFR Mild decrease of GFR on the last couple readings and we will check CMP for creatinine BUN and GFR calculation. - Comprehensive metabolic panel with GFR  4. Hyperglycemia 1 episode of elevated hyperglycemia we will check lipid panel and fasting glucose. - Lipid Panel With LDL/HDL Ratio     Alayne Allis, MD

## 2023-12-18 LAB — CBC WITH DIFFERENTIAL/PLATELET
Basophils Absolute: 0.1 10*3/uL (ref 0.0–0.2)
Basos: 1 %
EOS (ABSOLUTE): 0.1 10*3/uL (ref 0.0–0.4)
Eos: 3 %
Hematocrit: 44.9 % (ref 34.0–46.6)
Hemoglobin: 15 g/dL (ref 11.1–15.9)
Immature Grans (Abs): 0 10*3/uL (ref 0.0–0.1)
Immature Granulocytes: 0 %
Lymphocytes Absolute: 0.9 10*3/uL (ref 0.7–3.1)
Lymphs: 21 %
MCH: 31.8 pg (ref 26.6–33.0)
MCHC: 33.4 g/dL (ref 31.5–35.7)
MCV: 95 fL (ref 79–97)
Monocytes Absolute: 0.3 10*3/uL (ref 0.1–0.9)
Monocytes: 8 %
Neutrophils Absolute: 2.8 10*3/uL (ref 1.4–7.0)
Neutrophils: 67 %
Platelets: 149 10*3/uL — ABNORMAL LOW (ref 150–450)
RBC: 4.72 x10E6/uL (ref 3.77–5.28)
RDW: 13.3 % (ref 11.7–15.4)
WBC: 4.1 10*3/uL (ref 3.4–10.8)

## 2023-12-18 LAB — COMPREHENSIVE METABOLIC PANEL WITH GFR
ALT: 13 IU/L (ref 0–32)
AST: 26 IU/L (ref 0–40)
Albumin: 4.6 g/dL (ref 3.6–4.6)
Alkaline Phosphatase: 101 IU/L (ref 44–121)
BUN/Creatinine Ratio: 19 (ref 12–28)
BUN: 27 mg/dL (ref 10–36)
Bilirubin Total: 1.2 mg/dL (ref 0.0–1.2)
CO2: 25 mmol/L (ref 20–29)
Calcium: 10 mg/dL (ref 8.7–10.3)
Chloride: 100 mmol/L (ref 96–106)
Creatinine, Ser: 1.41 mg/dL — ABNORMAL HIGH (ref 0.57–1.00)
Globulin, Total: 2.6 g/dL (ref 1.5–4.5)
Glucose: 89 mg/dL (ref 70–99)
Potassium: 5 mmol/L (ref 3.5–5.2)
Sodium: 141 mmol/L (ref 134–144)
Total Protein: 7.2 g/dL (ref 6.0–8.5)
eGFR: 35 mL/min/{1.73_m2} — ABNORMAL LOW (ref >59)

## 2023-12-18 LAB — LIPID PANEL WITH LDL/HDL RATIO
Cholesterol, Total: 186 mg/dL (ref 100–199)
HDL: 62 mg/dL (ref >39)
LDL Chol Calc (NIH): 108 mg/dL — ABNORMAL HIGH (ref 0–99)
LDL/HDL Ratio: 1.7 ratio (ref 0.0–3.2)
Triglycerides: 86 mg/dL (ref 0–149)
VLDL Cholesterol Cal: 16 mg/dL (ref 5–40)

## 2024-01-15 ENCOUNTER — Other Ambulatory Visit: Payer: Self-pay

## 2024-01-15 DIAGNOSIS — R944 Abnormal results of kidney function studies: Secondary | ICD-10-CM

## 2024-01-17 ENCOUNTER — Ambulatory Visit: Payer: Self-pay | Admitting: Family Medicine

## 2024-01-17 LAB — COMPREHENSIVE METABOLIC PANEL WITH GFR
ALT: 13 IU/L (ref 0–32)
AST: 22 IU/L (ref 0–40)
Albumin: 4.4 g/dL (ref 3.6–4.6)
Alkaline Phosphatase: 103 IU/L (ref 44–121)
BUN/Creatinine Ratio: 20 (ref 12–28)
BUN: 25 mg/dL (ref 10–36)
Bilirubin Total: 1 mg/dL (ref 0.0–1.2)
CO2: 23 mmol/L (ref 20–29)
Calcium: 9.8 mg/dL (ref 8.7–10.3)
Chloride: 101 mmol/L (ref 96–106)
Creatinine, Ser: 1.22 mg/dL — ABNORMAL HIGH (ref 0.57–1.00)
Globulin, Total: 2.4 g/dL (ref 1.5–4.5)
Glucose: 91 mg/dL (ref 70–99)
Potassium: 4.7 mmol/L (ref 3.5–5.2)
Sodium: 139 mmol/L (ref 134–144)
Total Protein: 6.8 g/dL (ref 6.0–8.5)
eGFR: 41 mL/min/{1.73_m2} — ABNORMAL LOW (ref >59)

## 2024-01-17 LAB — MICROSCOPIC EXAMINATION: Casts: NONE SEEN /LPF

## 2024-01-17 LAB — URINALYSIS, ROUTINE W REFLEX MICROSCOPIC
Glucose, UA: NEGATIVE
Nitrite, UA: NEGATIVE
Specific Gravity, UA: 1.025 (ref 1.005–1.030)
Urobilinogen, Ur: 1 mg/dL (ref 0.2–1.0)
pH, UA: 5.5 (ref 5.0–7.5)

## 2024-04-28 ENCOUNTER — Telehealth: Payer: Self-pay

## 2024-04-28 NOTE — Telephone Encounter (Signed)
 Called pt let her know that she will be getting her labs done at her visit.  KP

## 2024-04-28 NOTE — Telephone Encounter (Signed)
 Copied from CRM [id number]. Topic: Clinical - Lab/Test Results >> Apr 28, 2024  2:16 PM Emylou G wrote: Reason for CRM: Patient called.. will labs be included with her appt?  She is inquiring

## 2024-05-11 ENCOUNTER — Telehealth: Payer: Self-pay

## 2024-05-11 NOTE — Telephone Encounter (Signed)
 Copied from CRM #[id number]. Topic: Clinical - Request for Lab/Test Order >> May 11, 2024  9:08 AM Teresa Mays wrote: Reason for CRM: Pt's son Georgette wants to come by and retrieve the paper copy of her lab orders. Please advise when this is ready for him.   Best contact: [phone]

## 2024-05-12 ENCOUNTER — Ambulatory Visit (INDEPENDENT_AMBULATORY_CARE_PROVIDER_SITE_OTHER): Admitting: Student

## 2024-05-12 ENCOUNTER — Encounter: Payer: Self-pay | Admitting: Student

## 2024-05-12 VITALS — BP 122/72 | HR 64 | Ht 62.0 in | Wt 131.6 lb

## 2024-05-12 DIAGNOSIS — Z23 Encounter for immunization: Secondary | ICD-10-CM | POA: Diagnosis not present

## 2024-05-12 DIAGNOSIS — N179 Acute kidney failure, unspecified: Secondary | ICD-10-CM

## 2024-05-12 DIAGNOSIS — N1832 Chronic kidney disease, stage 3b: Secondary | ICD-10-CM

## 2024-05-12 DIAGNOSIS — I442 Atrioventricular block, complete: Secondary | ICD-10-CM

## 2024-05-12 DIAGNOSIS — N814 Uterovaginal prolapse, unspecified: Secondary | ICD-10-CM

## 2024-05-12 DIAGNOSIS — J3089 Other allergic rhinitis: Secondary | ICD-10-CM

## 2024-05-12 DIAGNOSIS — I10 Essential (primary) hypertension: Secondary | ICD-10-CM | POA: Diagnosis not present

## 2024-05-12 NOTE — Telephone Encounter (Signed)
 Labs printed and placed up front for pick up.  - Juel Bellerose M.

## 2024-05-12 NOTE — Assessment & Plan Note (Signed)
 S/p pacemaker placement. Doing well with this. Managed by cardiology.

## 2024-05-12 NOTE — Patient Instructions (Signed)
 Please take Claritin/loratadine, zyrtec/cetrizine, allegra/fexofenadine as needed for stuffy nose

## 2024-05-12 NOTE — Progress Notes (Unsigned)
 Established Patient Office Visit  Subjective   Patient ID: Teresa Mays, female    DOB: 28-Nov-1927  Age: 89 y.o. MRN: [id number]  Chief Complaint  Patient presents with   Hypertension   Diabetes    Teresa Mays with medical hx listed below presents today for transfer of care. Previously seeing Dr. Joshua for primary care who recently retired.  Has been having cramping in legs but well controlled with magnesium, mustard, and hyland's. Please refer to problem based charting for further details and assessment and plan of current problem and chronic medical conditions.   Patient Active Problem List   Diagnosis Date Noted   AKI (acute kidney injury) (HCC) 05/13/2024   Allergic rhinitis 05/13/2024   Complete heart block (HCC) 08/10/2020   Uterovaginal prolapse 11/11/2015   BP (high blood pressure) 01/27/2014   Encounter for checking and testing of cardiac pacemaker pulse generator (battery) 01/27/2014      ROS Refer to HPI    Objective:     Outpatient Encounter Medications as of 05/12/2024  Medication Sig   Docusate Calcium (STOOL SOFTENER PO) Take 100 mg by mouth at bedtime.   Magnesium 250 MG CAPS Take by mouth.   NORVASC  2.5 MG tablet Take 2.5 tablets by mouth at bedtime. Dr Paraschos   [DISCONTINUED] triamcinolone  (NASACORT ) 55 MCG/ACT AERO nasal inhaler Place 2 sprays into the nose daily.   [DISCONTINUED] OXYQUINOLONE SULFATE VAGINAL (TRIMO-SAN) 0.025 % GEL Place 1 application vaginally 3 (three) times a week.  (Patient not taking: Reported on 12/17/2023)   [DISCONTINUED] albuterol  (PROVENTIL ) (2.5 MG/3ML) 0.083% nebulizer solution 2.5 mg    No facility-administered encounter medications on file as of 05/12/2024.    BP 122/72   Pulse 64   Ht 5' 2 (1.575 m)   Wt 131 lb 9.6 oz (59.7 kg)   SpO2 97%   BMI 24.07 kg/m  BP Readings from Last 3 Encounters:  05/12/24 122/72  12/17/23 123/82  05/14/23 124/78    Physical Exam Constitutional:       Appearance: Normal appearance.  HENT:     Head: Normocephalic and atraumatic.  Cardiovascular:     Rate and Rhythm: Normal rate and regular rhythm.     Pulses: Normal pulses.     Heart sounds: Murmur (systolic) heard.  Pulmonary:     Effort: Pulmonary effort is normal.     Breath sounds: No rhonchi or rales.  Abdominal:     General: Abdomen is flat. Bowel sounds are normal. There is no distension.     Palpations: Abdomen is soft.     Tenderness: There is no abdominal tenderness.  Musculoskeletal:        General: Normal range of motion.     Right lower leg: No edema.     Left lower leg: No edema.  Skin:    General: Skin is warm and dry.     Capillary Refill: Capillary refill takes less than 2 seconds.  Neurological:     General: No focal deficit present.     Mental Status: She is alert and oriented to person, place, and time.  Psychiatric:        Mood and Affect: Mood normal.        Behavior: Behavior normal.        05/12/2024   10:33 AM 05/14/2023   11:34 AM 01/01/2022    3:13 PM  Depression screen PHQ 2/9  Decreased Interest 2 0 0  Down, Depressed, Hopeless 2 0 0  PHQ - 2 Score 4 0 0  Altered sleeping 2 0   Tired, decreased energy 2 0   Change in appetite 0 0   Feeling bad or failure about yourself  0 0   Trouble concentrating 0 0   Moving slowly or fidgety/restless 0 0   Suicidal thoughts 0 0   PHQ-9 Score 8 0   Difficult doing work/chores Not difficult at all Not difficult at all        05/12/2024   10:33 AM 05/14/2023   11:34 AM 12/01/2020   11:22 AM 12/31/2019   10:18 AM  GAD 7 : Generalized Anxiety Score  Nervous, Anxious, on Edge 1 0 0 0  Control/stop worrying 1 0 0 0  Worry too much - different things 1 0 0 0  Trouble relaxing 0 0 0 0  Restless 0 0 0 0  Easily annoyed or irritable 0 0 0 0  Afraid - awful might happen 0 0 0 0  Total GAD 7 Score 3 0 0 0  Anxiety Difficulty Not difficult at all Not difficult at all Not difficult at all     Results for  orders placed or performed in visit on 05/12/24  Comprehensive Metabolic Panel (CMET)  Result Value Ref Range   Glucose 83 70 - 99 mg/dL   BUN 22 10 - 36 mg/dL   Creatinine, Ser 8.95 (H) 0.57 - 1.00 mg/dL   eGFR 50 (L) >40 fO/fpw/8.26   BUN/Creatinine Ratio 21 12 - 28   Sodium 137 134 - 144 mmol/L   Potassium 4.5 3.5 - 5.2 mmol/L   Chloride 98 96 - 106 mmol/L   CO2 21 20 - 29 mmol/L   Calcium 9.6 8.7 - 10.3 mg/dL   Total Protein 6.7 6.0 - 8.5 g/dL   Albumin 4.6 3.6 - 4.6 g/dL   Globulin, Total 2.1 1.5 - 4.5 g/dL   Bilirubin Total 1.3 (H) 0.0 - 1.2 mg/dL   Alkaline Phosphatase 101 44 - 121 IU/L   AST 23 0 - 40 IU/L   ALT 13 0 - 32 IU/L    Last CBC Lab Results  Component Value Date   WBC 4.1 12/17/2023   HGB 15.0 12/17/2023   HCT 44.9 12/17/2023   MCV 95 12/17/2023   MCH 31.8 12/17/2023   RDW 13.3 12/17/2023   PLT 149 (L) 12/17/2023   Last metabolic panel Lab Results  Component Value Date   GLUCOSE 83 05/12/2024   NA 137 05/12/2024   K 4.5 05/12/2024   CL 98 05/12/2024   CO2 21 05/12/2024   BUN 22 05/12/2024   CREATININE 1.04 (H) 05/12/2024   EGFR 50 (L) 05/12/2024   CALCIUM 9.6 05/12/2024   PHOS 3.7 12/31/2019   PROT 6.7 05/12/2024   ALBUMIN 4.6 05/12/2024   LABGLOB 2.1 05/12/2024   BILITOT 1.3 (H) 05/12/2024   ALKPHOS 101 05/12/2024   AST 23 05/12/2024   ALT 13 05/12/2024   ANIONGAP 7 01/07/2021   Last lipids Lab Results  Component Value Date   CHOL 186 12/17/2023   HDL 62 12/17/2023   LDLCALC 108 (H) 12/17/2023   TRIG 86 12/17/2023      The ASCVD Risk score (Arnett DK, et al., 2019) failed to calculate for the following reasons:   The 2019 ASCVD risk score is only valid for ages 38 to 67    Assessment & Plan:  Primary hypertension Assessment & Plan: BP well controlled on amlodipine  2.5 mg daily. No dizziness  or leg swelling on this medication. Continue current medication.    Complete heart block [facility]) Assessment & Plan: S/p pacemaker  placement. Doing well with this. Managed by cardiology.    Stage 3b chronic kidney disease (HCC)  Encounter for immunization -     Flu vaccine HIGH DOSE PF(Fluzone Trivalent)  AKI (acute kidney injury) (HCC) Assessment & Plan: Elevated Cr at last visit vs CKD. She reports she is drinking plenty of water, rarely uses NSAIDs. Does take phenylephrine  and pseudoephed for sinus congestion occasionally. Counseled her to avoid this as this can worsen kidney function. BMP today.   Orders: -     Comprehensive metabolic panel with GFR  Non-seasonal allergic rhinitis, unspecified trigger Assessment & Plan: Intermittent sinus congestion and rhinorrhea takes pseudoephedrine or phenylephrine . Recommended H2 antihistamine OTC as needed.    Uterovaginal prolapse Assessment & Plan: Has pessary, follow with GYN periodically for this      Return in about 6 months (around 11/09/2024) for kidney, HTN.    Harlene Saddler, MD

## 2024-05-12 NOTE — Assessment & Plan Note (Signed)
 BP well controlled on amlodipine  2.5 mg daily. No dizziness or leg swelling on this medication. Continue current medication.

## 2024-05-13 ENCOUNTER — Ambulatory Visit: Payer: Self-pay | Admitting: Student

## 2024-05-13 DIAGNOSIS — J309 Allergic rhinitis, unspecified: Secondary | ICD-10-CM | POA: Insufficient documentation

## 2024-05-13 DIAGNOSIS — N179 Acute kidney failure, unspecified: Secondary | ICD-10-CM | POA: Insufficient documentation

## 2024-05-13 LAB — COMPREHENSIVE METABOLIC PANEL WITH GFR
ALT: 13 IU/L (ref 0–32)
AST: 23 IU/L (ref 0–40)
Albumin: 4.6 g/dL (ref 3.6–4.6)
Alkaline Phosphatase: 101 IU/L (ref 44–121)
BUN/Creatinine Ratio: 21 (ref 12–28)
BUN: 22 mg/dL (ref 10–36)
Bilirubin Total: 1.3 mg/dL — ABNORMAL HIGH (ref 0.0–1.2)
CO2: 21 mmol/L (ref 20–29)
Calcium: 9.6 mg/dL (ref 8.7–10.3)
Chloride: 98 mmol/L (ref 96–106)
Creatinine, Ser: 1.04 mg/dL — ABNORMAL HIGH (ref 0.57–1.00)
Globulin, Total: 2.1 g/dL (ref 1.5–4.5)
Glucose: 83 mg/dL (ref 70–99)
Potassium: 4.5 mmol/L (ref 3.5–5.2)
Sodium: 137 mmol/L (ref 134–144)
Total Protein: 6.7 g/dL (ref 6.0–8.5)
eGFR: 50 mL/min/1.73 — ABNORMAL LOW (ref >59)

## 2024-05-13 NOTE — Assessment & Plan Note (Addendum)
 Elevated Cr at last visit vs CKD. She reports she is drinking plenty of water, rarely uses NSAIDs. Does take phenylephrine  and pseudoephed for sinus congestion occasionally. Counseled her to avoid this as this can worsen kidney function. BMP today.

## 2024-05-13 NOTE — Assessment & Plan Note (Addendum)
 Intermittent sinus congestion and rhinorrhea takes pseudoephedrine or phenylephrine . Recommended H2 antihistamine OTC as needed.

## 2024-05-13 NOTE — Assessment & Plan Note (Signed)
 Has pessary, follow with GYN periodically for this

## 2024-06-17 ENCOUNTER — Ambulatory Visit

## 2024-08-19 ENCOUNTER — Ambulatory Visit

## 2024-08-19 DIAGNOSIS — Z0000 Encounter for general adult medical examination without abnormal findings: Secondary | ICD-10-CM | POA: Diagnosis not present

## 2024-08-19 NOTE — Progress Notes (Signed)
 Chief Complaint  Patient presents with   Medicare Wellness     Subjective:   Teresa Mays is a 89 y.o. female who presents for a Medicare Annual Wellness Visit.  Visit info / Clinical Intake: Medicare Wellness Visit Type:: Subsequent Annual Wellness Visit Persons participating in visit and providing information:: patient Medicare Wellness Visit Mode:: Telephone If telephone:: video declined Since this visit was completed virtually, some vitals may be partially provided or unavailable. Missing vitals are due to the limitations of the virtual format.: Unable to obtain vitals - no equipment If Telephone or Video please confirm:: I connected with patient using audio/video enable telemedicine. I verified patient identity with two identifiers, discussed telehealth limitations, and patient agreed to proceed. Patient Location:: home Provider Location:: office Interpreter Needed?: No Pre-visit prep was completed: yes AWV questionnaire completed by patient prior to visit?: no Living arrangements:: (!) lives alone Patient's Overall Health Status Rating: very good Typical amount of pain: none Does pain affect daily life?: no Are you currently prescribed opioids?: no  Dietary Habits and Nutritional Risks How many meals a day?: 3 Eats fruit and vegetables daily?: yes Most meals are obtained by: preparing own meals In the last 2 weeks, have you had any of the following?: none Diabetic:: no  Functional Status Activities of Daily Living (to include ambulation/medication): Independent Ambulation: Independent Home Assistive Devices/Equipment: Cane Medication Administration: Independent Home Management (perform basic housework or laundry): Independent Manage your own finances?: yes Primary transportation is: driving; family / friends Concerns about vision?: no *vision screening is required for WT[facility]* (wears glasses all day- Dr.King) Concerns about hearing?: no  Fall Screening Falls  in the past year?: 0 Number of falls in past year: 0 Was there an injury with Fall?: 0 Fall Risk Category Calculator: 0 Patient Fall Risk Level: Low Fall Risk  Fall Risk Patient at Risk for Falls Due to: No Fall Risks Fall risk Follow up: Falls evaluation completed; Falls prevention discussed  Home and Transportation Safety: All rugs have non-skid backing?: yes All stairs or steps have railings?: N/A, no stairs Grab bars in the bathtub or shower?: yes Have non-skid surface in bathtub or shower?: yes Good home lighting?: yes Regular seat belt use?: yes Hospital stays in the last year:: no  Cognitive Assessment Difficulty concentrating, remembering, or making decisions? : no Will 6CIT or Mini Cog be Completed: yes What year is it?: 0 points What month is it?: 0 points Give patient an address phrase to remember (5 components): [address] [place], [place] About what time is it?: 0 points Count backwards from 20 to 1: 0 points Say the months of the year in reverse: 0 points Repeat the address phrase from earlier: 0 points 6 CIT Score: 0 points  Advance Directives (For Healthcare) Does Patient Have a Medical Advance Directive?: No Would patient like information on creating a medical advance directive?: No - Patient declined  Reviewed/Updated  Reviewed/Updated: Reviewed All (Medical, Surgical, Family, Medications, Allergies, Care Teams, Patient Goals)    Allergies (verified) Epinephrine, Sulfa antibiotics, Mirabegron, and Pregabalin    Current Medications (verified) Outpatient Encounter Medications as of 08/19/2024  Medication Sig   Docusate Calcium (STOOL SOFTENER PO) Take 100 mg by mouth at bedtime.   Magnesium 250 MG CAPS Take by mouth.   NORVASC  2.5 MG tablet Take 2.5 tablets by mouth at bedtime. Dr Ammon   No facility-administered encounter medications on file as of 08/19/2024.    History: Past Medical History:  Diagnosis Date  Bladder prolapse, female,  acquired    Hypertension    Past Surgical History:  Procedure Laterality Date   ABDOMINAL HYSTERECTOMY     40 years ago   BREAST BIOPSY Left 1987   neg   BREAST SURGERY  1990   Fibroadenom right    COLONOSCOPY  2005   ORIF WRIST FRACTURE Left 09/01/2020   Procedure: OPEN REDUCTION INTERNAL FIXATION (ORIF) WRIST FRACTURE;  Surgeon: Kathlynn Sharper, MD;  Location: [facility] ORS;  Service: Orthopedics;  Laterality: Left;   PACEMAKER INSERTION  [id number]   PACEMAKER LEADLESS INSERTION N/A 08/10/2020   Procedure: PACEMAKER LEADLESS INSERTION;  Surgeon: Ammon Blunt, MD;  Location: [facility]VASIVE [facility];  Service: Cardiovascular;  Laterality: N/A;   PPM GENERATOR REMOVAL N/A 08/10/2020   Procedure: PPM GENERATOR REMOVAL;  Surgeon: Ammon Blunt, MD;  Location: [facility] INV[facility] CV [facility];  Service: Cardiovascular;  Laterality: N/A;   Family History  Problem Relation Age of Onset   Breast cancer Neg Hx    Social History   Occupational History   Occupation: retired  Tobacco Use   Smoking status: Never   Smokeless tobacco: Never  Vaping Use   Vaping status: Never Used  Substance and Sexual Activity   Alcohol use: No    Alcohol/week: 0.0 standard drinks of alcohol   Drug use: No   Sexual activity: Never   Tobacco Counseling Counseling given: Not Answered  SDOH Screenings   Food Insecurity: No Food Insecurity (01/01/2022)  Housing: Unknown (11/01/2023)   [facility]  Transportation Needs: No Transportation Needs (01/01/2022)  Alcohol Screen: Low Risk (01/01/2022)  Depression (PHQ2-9): Low Risk (08/19/2024)  Financial Resource Strain: Low Risk (01/01/2022)  Physical Activity: Insufficiently Active (08/19/2024)  Social Connections: Socially Isolated (01/01/2022)  Stress: No Stress Concern Present (08/19/2024)  Tobacco Use: Low Risk (08/19/2024)   See flowsheets for full screening details  Depression Screen PHQ 2 & 9 Depression Scale- Over the past 2  weeks, how often have you been bothered by any of the following problems? Little interest or pleasure in doing things: 0 Feeling down, depressed, or hopeless (PHQ Adolescent also includes...irritable): 0 PHQ-2 Total Score: 0 Trouble falling or staying asleep, or sleeping too much: 0 Feeling tired or having little energy: 0 Poor appetite or overeating (PHQ Adolescent also includes...weight loss): 0 Feeling bad about yourself - or that you are a failure or have let yourself or your family down: 0 Trouble concentrating on things, such as reading the newspaper or watching television (PHQ Adolescent also includes...like school work): 0 Moving or speaking so slowly that other people could have noticed. Or the opposite - being so fidgety or restless that you have been moving around a lot more than usual: 0 Thoughts that you would be better off dead, or of hurting yourself in some way: 0 PHQ-9 Total Score: 0 If you checked off any problems, how difficult have these problems made it for you to do your work, take care of things at home, or get along with other people?: Not difficult at all  Depression Treatment Depression Interventions/Treatment : EYV7-0 Score <4 Follow-up Not Indicated     Goals Addressed             This Visit's Progress    DIET - EAT MORE FRUITS AND VEGETABLES               Objective:    There were no vitals filed for this visit. There is no height or weight  on file to calculate BMI.  Hearing/Vision screen Hearing Screening - Comments:: NO AIDS Vision Screening - Comments:: WEARS GLASSES ALL DAY- DR.KING Immunizations and Health Maintenance Health Maintenance  Topic Date Due   DTaP/Tdap/Td (1 - Tdap) Never done   Pneumococcal Vaccine: 50+ Years (1 of 2 - PCV) Never done   Mammogram  02/04/2020   COVID-19 Vaccine (3 - 2025-26 season) 05/04/2024   Medicare Annual Wellness (AWV)  08/19/2025   Influenza Vaccine  Completed   Zoster Vaccines- Shingrix  Completed    Meningococcal B Vaccine  Aged Out   Bone Density Scan  Discontinued        Assessment/Plan:  This is a routine wellness examination for Suriah.  Patient Care Team: Lemon Raisin, MD as PCP - General (Internal Medicine) Ammon Blunt, MD as Consulting Physician (Cardiology) Vicenta Calton Boron, NP as Nurse Practitioner (Gynecology) Myrna Adine Anes, MD as Consulting Physician (Ophthalmology)  I have personally reviewed and noted the following in the patients chart:   Medical and social history Use of alcohol, tobacco or illicit drugs  Current medications and supplements including opioid prescriptions. Functional ability and status Nutritional status Physical activity Advanced directives List of other physicians Hospitalizations, surgeries, and ER visits in previous 12 months Vitals Screenings to include cognitive, depression, and falls Referrals and appointments  No orders of the defined types were placed in this encounter.  In addition, I have reviewed and discussed with patient certain preventive protocols, quality metrics, and best practice recommendations. A written personalized care plan for preventive services as well as general preventive health recommendations were provided to patient.   Jhonnie GORMAN Das, LPN   [id number]   Return in 1 year (on 08/19/2025).  After Visit Summary: (MyChart) Due to this being a telephonic visit, the after visit summary with patients personalized plan was offered to patient via MyChart   Nurse Notes: NEEDS PNA, TDAP; AGED OUT OF COLONOSCOPY, MAMMOGRAM & BDS

## 2024-08-19 NOTE — Patient Instructions (Addendum)
 Teresa Mays,  Thank you for taking the time for your Medicare Wellness Visit. I appreciate your continued commitment to your health goals. Please review the care plan we discussed, and feel free to reach out if I can assist you further.  Please note that Annual Wellness Visits do not include a physical exam. Some assessments may be limited, especially if the visit was conducted virtually. If needed, we may recommend an in-person follow-up with your provider.  Ongoing Care Seeing your primary care provider every 3 to 6 months helps us  monitor your health and provide consistent, personalized care.   Referrals If a referral was made during today's visit and you haven't received any updates within two weeks, please contact the referred provider directly to check on the status.  Recommended Screenings:  Health Maintenance  Topic Date Due   DTaP/Tdap/Td vaccine (1 - Tdap) Never done   Pneumococcal Vaccine for age over 1 (1 of 2 - PCV) Never done   Breast Cancer Screening  02/04/2020   COVID-19 Vaccine (3 - 2025-26 season) 05/04/2024   Medicare Annual Wellness Visit  08/19/2025   Flu Shot  Completed   Zoster (Shingles) Vaccine  Completed   Meningitis B Vaccine  Aged Out   Osteoporosis screening with Bone Density Scan  Discontinued     Vision: Annual vision screenings are recommended for early detection of glaucoma, cataracts, and diabetic retinopathy. These exams can also reveal signs of chronic conditions such as diabetes and high blood pressure.  Dental: Annual dental screenings help detect early signs of oral cancer, gum disease, and other conditions linked to overall health, including heart disease and diabetes.  Please see the attached documents for additional preventive care recommendations.   NEXT AWV  09/09/25 @ 3:10 PM IN PERSON

## 2024-09-11 ENCOUNTER — Encounter: Payer: Self-pay | Admitting: Student

## 2024-09-11 ENCOUNTER — Ambulatory Visit (INDEPENDENT_AMBULATORY_CARE_PROVIDER_SITE_OTHER): Admitting: Student

## 2024-09-11 VITALS — BP 122/74 | HR 72 | Temp 99.1°F | Ht 62.0 in | Wt 131.0 lb

## 2024-09-11 DIAGNOSIS — J209 Acute bronchitis, unspecified: Secondary | ICD-10-CM | POA: Diagnosis not present

## 2024-09-11 MED ORDER — PROMETHAZINE-DM 6.25-15 MG/5ML PO SYRP: 5.0000 mL | ORAL_SOLUTION | Freq: Four times a day (QID) | ORAL | 0 refills | Status: AC | PRN

## 2024-09-11 MED ORDER — OSELTAMIVIR PHOSPHATE 75 MG PO CAPS: 75.0000 mg | ORAL_CAPSULE | Freq: Every day | ORAL | 0 refills | Status: DC

## 2024-09-11 MED ORDER — OSELTAMIVIR PHOSPHATE 75 MG PO CAPS: 75.0000 mg | ORAL_CAPSULE | Freq: Two times a day (BID) | ORAL | 0 refills | Status: AC

## 2024-09-11 NOTE — Progress Notes (Signed)
 "  Established Patient Office Visit  Patient ID: Teresa Mays, female    DOB: 18-May-1928  Age: 89 y.o. MRN: [id number] PCP: Lemon Raisin, MD  Chief Complaint  Patient presents with   Nasal Congestion    Exposure to Flu Wednesday, nasal congestion, coughing, body aches last night    Subjective:     Discussed the use of AI scribe software for clinical note transcription with the patient, who gave verbal consent to proceed.  History of Present Illness Teresa Mays is a 89 year old female who presents with cough and sinus drainage.  Symptoms began last night around 11:30 PM last night with malaise, followed by persistent cough and sinus drainage she describes as hot water. She has taken antihistamine, Mucinex , and ginger and lemon tea, which she is not sure is helping. She feels warmer than usual but has not checked her temperature.   She denies chills, shortness of breath, chest pain, nausea, vomiting, dizziness, or muscle aches.   Her son recently visited, and his family had flu and COVID-19.   She lives independently and has limited her driving due to concerns about reaction time and road safety.  Patient Active Problem List   Diagnosis Date Noted   AKI (acute kidney injury) 05/13/2024   Allergic rhinitis 05/13/2024   Complete heart block (HCC) 08/10/2020   Uterovaginal prolapse 11/11/2015   BP (high blood pressure) 01/27/2014   Encounter for checking and testing of cardiac pacemaker pulse generator (battery) 01/27/2014      ROS    Objective:     BP 122/74   Pulse 72   Temp 99.1 F (37.3 C) (Oral)   Ht 5' 2 (1.575 m)   Wt 131 lb (59.4 kg)   SpO2 95%   BMI 23.96 kg/m  BP Readings from Last 3 Encounters:  09/11/24 122/74  05/12/24 122/72  12/17/23 123/82    Physical Exam Constitutional:      Appearance: Normal appearance.  HENT:     Right Ear: There is impacted cerumen.     Left Ear: There is no impacted cerumen.     Nose: Rhinorrhea  present.     Right Turbinates: Not enlarged or swollen.     Left Turbinates: Not enlarged or swollen.     Right Sinus: No maxillary sinus tenderness or frontal sinus tenderness.     Left Sinus: No maxillary sinus tenderness or frontal sinus tenderness.     Mouth/Throat:     Mouth: Mucous membranes are moist.     Pharynx: Oropharynx is clear. No oropharyngeal exudate or posterior oropharyngeal erythema.  Cardiovascular:     Rate and Rhythm: Normal rate and regular rhythm.  Pulmonary:     Effort: Pulmonary effort is normal. No respiratory distress.     Breath sounds: Normal breath sounds. No rhonchi or rales.  Abdominal:     General: Abdomen is flat. Bowel sounds are normal. There is no distension.     Palpations: Abdomen is soft.     Tenderness: There is no abdominal tenderness.  Musculoskeletal:        General: Normal range of motion.     Right lower leg: No edema.     Left lower leg: No edema.  Skin:    General: Skin is warm and dry.     Capillary Refill: Capillary refill takes less than 2 seconds.  Neurological:     General: No focal deficit present.     Mental Status: She is alert and  oriented to person, place, and time.  Psychiatric:        Mood and Affect: Mood normal.        Behavior: Behavior normal.       The ASCVD Risk score (Arnett DK, et al., 2019) failed to calculate for the following reasons:   The 2019 ASCVD risk score is only valid for ages 10 to 65   * - Cholesterol units were assumed    Assessment & Plan:   Problem List Items Addressed This Visit   None Visit Diagnoses       Acute bronchitis, unspecified organism    -  Primary      Assessment & Plan Acute upper respiratory infection Possible viral etiology, with recent flu exposure. COVID and Flu negative.  - Prescribed Tamiflu  given recent exposure, possible false negative given onset of sx <24 hours ago  - Advised to monitor temperature and report fever over 100F. - Provided cough syrup for  symptomatic relief. - Encouraged hydration and rest.  Other orders -     Promethazine -DM; Take 5 mLs by mouth 4 (four) times daily as needed for up to 9 days for cough.  Dispense: 118 mL; Refill: 0 -     Oseltamivir  Phosphate; Take 1 capsule (75 mg total) by mouth twice daily for 5 days.  Dispense: 10 capsule; Refill: 0     Harlene Saddler, MD [facility] at [facility]   "

## 2024-11-10 ENCOUNTER — Ambulatory Visit: Admitting: Student

## 2025-09-09 ENCOUNTER — Ambulatory Visit
